# Patient Record
Sex: Male | Born: 1935 | Race: White | Hispanic: No | Smoking: Former smoker
Health system: Southern US, Community
[De-identification: ages and names within clinical notes are randomized; demographics above are authoritative.]

## PROBLEM LIST (undated history)

## (undated) DIAGNOSIS — I509 Heart failure, unspecified: Secondary | ICD-10-CM

## (undated) DIAGNOSIS — E119 Type 2 diabetes mellitus without complications: Secondary | ICD-10-CM

## (undated) DIAGNOSIS — I10 Essential (primary) hypertension: Secondary | ICD-10-CM

## (undated) DIAGNOSIS — J449 Chronic obstructive pulmonary disease, unspecified: Secondary | ICD-10-CM

## (undated) DIAGNOSIS — F32A Depression, unspecified: Secondary | ICD-10-CM

## (undated) DIAGNOSIS — H3530 Unspecified macular degeneration: Secondary | ICD-10-CM

## (undated) DIAGNOSIS — I429 Cardiomyopathy, unspecified: Secondary | ICD-10-CM

## (undated) DIAGNOSIS — I2510 Atherosclerotic heart disease of native coronary artery without angina pectoris: Secondary | ICD-10-CM

## (undated) DIAGNOSIS — F329 Major depressive disorder, single episode, unspecified: Secondary | ICD-10-CM

## (undated) DIAGNOSIS — H9190 Unspecified hearing loss, unspecified ear: Secondary | ICD-10-CM

## (undated) DIAGNOSIS — M1990 Unspecified osteoarthritis, unspecified site: Secondary | ICD-10-CM

## (undated) DIAGNOSIS — E785 Hyperlipidemia, unspecified: Secondary | ICD-10-CM

## (undated) HISTORY — PX: CHOLECYSTECTOMY: SHX55

## (undated) HISTORY — PX: HERNIA REPAIR: SHX51

## (undated) HISTORY — PX: CORONARY ARTERY BYPASS GRAFT: SHX141

---

## 2007-01-22 ENCOUNTER — Ambulatory Visit: Payer: Self-pay | Admitting: Internal Medicine

## 2009-05-02 ENCOUNTER — Inpatient Hospital Stay: Payer: Self-pay | Admitting: Internal Medicine

## 2016-12-27 ENCOUNTER — Ambulatory Visit: Payer: Self-pay

## 2016-12-28 ENCOUNTER — Ambulatory Visit: Payer: Self-pay | Admitting: Urology

## 2016-12-28 NOTE — Progress Notes (Deleted)
   12/28/2016 2:07 PM   Riesa PopeEdgar A Searing [birth year] [id number]  Referring provider: Mickey Farberhies, David, MD [address] [facility] [place], [place] [address]  No chief complaint on file.   HPI: ***     PMH: No past medical history on file.  Surgical History: No past surgical history on file.  Home Medications:  Allergies as of 12/28/2016   Not on File     Medication List    as of 12/28/2016  2:07 PM   You have not been prescribed any medications.     Allergies: Allergies not on file  Family History: No family history on file.  Social History:  has no tobacco, alcohol, and drug history on file.  ROS:                                        Physical Exam: There were no vitals taken for this visit.  Constitutional:  Alert and oriented, No acute distress. HEENT:  AT, moist mucus membranes.  Trachea midline, no masses. Cardiovascular: No clubbing, cyanosis, or edema. Respiratory: Normal respiratory effort, no increased work of breathing. GI: Abdomen is soft, nontender, nondistended, no abdominal masses GU: No CVA tenderness. *** Skin: No rashes, bruises or suspicious lesions. Lymph: No cervical or inguinal adenopathy. Neurologic: Grossly intact, no focal deficits, moving all 4 extremities. Psychiatric: Normal mood and affect.  Laboratory Data: No results found for: WBC, HGB, HCT, MCV, PLT  No results found for: CREATININE  No results found for: PSA  No results found for: TESTOSTERONE  No results found for: HGBA1C  Urinalysis No results found for: COLORURINE, APPEARANCEUR, LABSPEC, PHURINE, GLUCOSEU, HGBUR, BILIRUBINUR, KETONESUR, PROTEINUR, UROBILINOGEN, NITRITE, LEUKOCYTESUR  Pertinent Imaging: ***  Assessment & Plan:  ***  There are no diagnoses linked to this encounter.  No Follow-up on file.  Vanna ScotlandAshley Hartlyn Reigel, MD  [facility] [address], [address] [place], [place] [address] [phone]  [phone]

## 2017-01-25 ENCOUNTER — Ambulatory Visit: Payer: Medicare Other | Admitting: Urology

## 2017-11-06 NOTE — Anesthesia Preprocedure Evaluation (Addendum)
Anesthesia Evaluation  Patient identified by MRN, date of birth, ID band Patient awake    Reviewed: Allergy & Precautions, NPO status , Patient's Chart, lab work & pertinent test results  History of Anesthesia Complications Negative for: history of anesthetic complications  Airway Mallampati: II  TM Distance: >3 FB Neck ROM: Full    Dental  (+) Edentulous Upper, Edentulous Lower   Pulmonary COPD,    Pulmonary exam normal breath sounds clear to auscultation       Cardiovascular hypertension, + CAD (s/p CABG) and +CHF  Normal cardiovascular exam Rhythm:Regular Rate:Normal  4 vessel CABG in 1997.  Echo 2015 showed LVEF 30%, mod AR/MR.     Neuro/Psych HOH    GI/Hepatic negative GI ROS,   Endo/Other  diabetes, Type 2, Insulin Dependent  Renal/GU negative Renal ROS     Musculoskeletal   Abdominal   Peds  Hematology negative hematology ROS (+)   Anesthesia Other Findings BPH  Reproductive/Obstetrics                            Anesthesia Physical Anesthesia Plan  ASA: III  Anesthesia Plan: MAC   Post-op Pain Management:    Induction: Intravenous  PONV Risk Score and Plan: 1 and TIVA and Midazolam  Airway Management Planned: Natural Airway  Additional Equipment:   Intra-op Plan:   Post-operative Plan:   Informed Consent: I have reviewed the patients History and Physical, chart, labs and discussed the procedure including the risks, benefits and alternatives for the proposed anesthesia with the patient or authorized representative who has indicated his/her understanding and acceptance.     Plan Discussed with: CRNA  Anesthesia Plan Comments:         Anesthesia Quick Evaluation

## 2017-11-08 NOTE — Discharge Instructions (Signed)

## 2017-11-12 ENCOUNTER — Ambulatory Visit: Payer: Medicare Other | Admitting: Anesthesiology

## 2017-11-12 ENCOUNTER — Ambulatory Visit
Admission: RE | Admit: 2017-11-12 | Discharge: 2017-11-12 | Disposition: A | Discharge status: Home / Self Care | Payer: Medicare Other | Source: Ambulatory Visit | Attending: Ophthalmology | Admitting: Ophthalmology

## 2017-11-12 ENCOUNTER — Encounter
Admission: RE | Disposition: A | Discharge status: Home / Self Care | Payer: Self-pay | Source: Ambulatory Visit | Attending: Ophthalmology

## 2017-11-12 DIAGNOSIS — R2243 Localized swelling, mass and lump, lower limb, bilateral: Secondary | ICD-10-CM | POA: Diagnosis not present

## 2017-11-12 DIAGNOSIS — M1990 Unspecified osteoarthritis, unspecified site: Secondary | ICD-10-CM | POA: Diagnosis not present

## 2017-11-12 DIAGNOSIS — I10 Essential (primary) hypertension: Secondary | ICD-10-CM | POA: Diagnosis not present

## 2017-11-12 DIAGNOSIS — Z951 Presence of aortocoronary bypass graft: Secondary | ICD-10-CM | POA: Diagnosis not present

## 2017-11-12 DIAGNOSIS — I2510 Atherosclerotic heart disease of native coronary artery without angina pectoris: Secondary | ICD-10-CM | POA: Diagnosis not present

## 2017-11-12 DIAGNOSIS — E539 Vitamin B deficiency, unspecified: Secondary | ICD-10-CM | POA: Insufficient documentation

## 2017-11-12 DIAGNOSIS — E1140 Type 2 diabetes mellitus with diabetic neuropathy, unspecified: Secondary | ICD-10-CM | POA: Insufficient documentation

## 2017-11-12 DIAGNOSIS — E559 Vitamin D deficiency, unspecified: Secondary | ICD-10-CM | POA: Insufficient documentation

## 2017-11-12 DIAGNOSIS — E7800 Pure hypercholesterolemia, unspecified: Secondary | ICD-10-CM | POA: Diagnosis not present

## 2017-11-12 DIAGNOSIS — R2681 Unsteadiness on feet: Secondary | ICD-10-CM | POA: Insufficient documentation

## 2017-11-12 DIAGNOSIS — E1136 Type 2 diabetes mellitus with diabetic cataract: Secondary | ICD-10-CM | POA: Diagnosis not present

## 2017-11-12 DIAGNOSIS — H2512 Age-related nuclear cataract, left eye: Secondary | ICD-10-CM | POA: Diagnosis present

## 2017-11-12 DIAGNOSIS — Z87891 Personal history of nicotine dependence: Secondary | ICD-10-CM | POA: Insufficient documentation

## 2017-11-12 HISTORY — DX: Heart failure, unspecified: I50.9

## 2017-11-12 HISTORY — DX: Type 2 diabetes mellitus without complications: E11.9

## 2017-11-12 HISTORY — DX: Atherosclerotic heart disease of native coronary artery without angina pectoris: I25.10

## 2017-11-12 HISTORY — DX: Chronic obstructive pulmonary disease, unspecified: J44.9

## 2017-11-12 HISTORY — DX: Depression, unspecified: F32.A

## 2017-11-12 HISTORY — DX: Essential (primary) hypertension: I10

## 2017-11-12 HISTORY — DX: Hyperlipidemia, unspecified: E78.5

## 2017-11-12 HISTORY — DX: Unspecified osteoarthritis, unspecified site: M19.90

## 2017-11-12 HISTORY — DX: Unspecified macular degeneration: H35.30

## 2017-11-12 HISTORY — PX: CATARACT EXTRACTION W/PHACO: SHX586

## 2017-11-12 HISTORY — DX: Major depressive disorder, single episode, unspecified: F32.9

## 2017-11-12 HISTORY — DX: Cardiomyopathy, unspecified: I42.9

## 2017-11-12 HISTORY — DX: Unspecified hearing loss, unspecified ear: H91.90

## 2017-11-12 LAB — GLUCOSE, CAPILLARY
GLUCOSE-CAPILLARY: 63 mg/dL — AB (ref 65–99)
GLUCOSE-CAPILLARY: 99 mg/dL (ref 65–99)
GLUCOSE-CAPILLARY: 115 mg/dL — AB (ref 65–99)
Glucose-Capillary: 65 mg/dL (ref 65–99)

## 2017-11-12 SURGERY — PHACOEMULSIFICATION, CATARACT, WITH IOL INSERTION
Anesthesia: Monitor Anesthesia Care | Site: Eye | Laterality: Left | Wound class: "Clean "

## 2017-11-12 MED ORDER — ONDANSETRON HCL 4 MG/2ML IJ SOLN: 4.0000 mg | Freq: Once | INTRAMUSCULAR | Status: DC | PRN

## 2017-11-12 MED ORDER — LIDOCAINE HCL (PF) 2 % IJ SOLN
INTRAMUSCULAR | Status: DC | PRN
  Administered 2017-11-12: 1.5 mL via INTRAOCULAR

## 2017-11-12 MED ORDER — SODIUM HYALURONATE 10 MG/ML IO SOLN
INTRAOCULAR | Status: DC | PRN
  Administered 2017-11-12: 0.55 mL via INTRAOCULAR

## 2017-11-12 MED ORDER — ACETAMINOPHEN 325 MG PO TABS: 650.0000 mg | ORAL_TABLET | Freq: Once | ORAL | Status: DC | PRN

## 2017-11-12 MED ORDER — DEXTROSE 50 % IV SOLN
12.5000 g | Freq: Once | INTRAVENOUS | Status: AC
  Administered 2017-11-12: 12.5 g via INTRAVENOUS

## 2017-11-12 MED ORDER — ARMC OPHTHALMIC DILATING DROPS
1.0000 "application " | OPHTHALMIC | Status: DC | PRN
  Administered 2017-11-12 (×3): 1 via OPHTHALMIC

## 2017-11-12 MED ORDER — FENTANYL CITRATE (PF) 100 MCG/2ML IJ SOLN
INTRAMUSCULAR | Status: DC | PRN
  Administered 2017-11-12: 25 ug via INTRAVENOUS

## 2017-11-12 MED ORDER — MIDAZOLAM HCL 2 MG/2ML IJ SOLN
INTRAMUSCULAR | Status: DC | PRN
  Administered 2017-11-12: 0.5 mg via INTRAVENOUS

## 2017-11-12 MED ORDER — EPINEPHRINE PF 1 MG/ML IJ SOLN
INTRAOCULAR | Status: DC | PRN
  Administered 2017-11-12: 92 mL via OPHTHALMIC

## 2017-11-12 MED ORDER — ACETAMINOPHEN 160 MG/5ML PO SOLN: 325.0000 mg | ORAL | Status: DC | PRN

## 2017-11-12 MED ORDER — LACTATED RINGERS IV SOLN: INTRAVENOUS | Status: DC

## 2017-11-12 MED ORDER — SODIUM HYALURONATE 23 MG/ML IO SOLN
INTRAOCULAR | Status: DC | PRN
  Administered 2017-11-12: 0.6 mL via INTRAOCULAR

## 2017-11-12 MED ORDER — MOXIFLOXACIN HCL 0.5 % OP SOLN
OPHTHALMIC | Status: DC | PRN
  Administered 2017-11-12: 0.2 mL via OPHTHALMIC

## 2017-11-12 SURGICAL SUPPLY — 17 items
CANNULA ANT/CHMB 27G (MISCELLANEOUS) ×1 IMPLANT
CANNULA ANT/CHMB 27GA (MISCELLANEOUS) ×3 IMPLANT
DISSECTOR HYDRO NUCLEUS 50X22 (MISCELLANEOUS) ×3 IMPLANT
GLOVE BIO SURGEON STRL SZ8 (GLOVE) ×3 IMPLANT
GLOVE SURG LX 7.5 STRW (GLOVE) ×2
GLOVE SURG LX STRL 7.5 STRW (GLOVE) ×1 IMPLANT
GOWN STRL REUS W/ TWL LRG LVL3 (GOWN DISPOSABLE) ×2 IMPLANT
GOWN STRL REUS W/TWL LRG LVL3 (GOWN DISPOSABLE) ×4
LENS IOL TECNIS ITEC 21.0 (Intraocular Lens) ×2 IMPLANT
MARKER SKIN DUAL TIP RULER LAB (MISCELLANEOUS) ×3 IMPLANT
PACK CATARACT (MISCELLANEOUS) ×3 IMPLANT
PACK DR. KING ARMS (PACKS) ×3 IMPLANT
PACK EYE AFTER SURG (MISCELLANEOUS) ×3 IMPLANT
SYR 3ML LL SCALE MARK (SYRINGE) ×3 IMPLANT
SYR TB 1ML LUER SLIP (SYRINGE) ×3 IMPLANT
WATER STERILE IRR 500ML POUR (IV SOLUTION) ×3 IMPLANT
WIPE NON LINTING 3.25X3.25 (MISCELLANEOUS) ×3 IMPLANT

## 2017-11-12 NOTE — Anesthesia Procedure Notes (Signed)
Procedure Name: MAC Performed by: Joby Hershkowitz, CRNA Pre-anesthesia Checklist: Patient identified, Emergency Drugs available, Suction available, Timeout performed and Patient being monitored Patient Re-evaluated:Patient Re-evaluated prior to induction Oxygen Delivery Method: Nasal cannula Placement Confirmation: positive ETCO2       

## 2017-11-12 NOTE — H&P (Signed)
The History and Physical notes are on paper, have been signed, and are to be scanned.   I have examined the patient and there are no changes to the H&P.   Willey Blade 11/12/2017 8:04 AM

## 2017-11-12 NOTE — Transfer of Care (Signed)
Immediate Anesthesia Transfer of Care Note  Patient: Fred Watson  Procedure(s) Performed: CATARACT EXTRACTION PHACO AND INTRAOCULAR LENS PLACEMENT (IOC) LEFT DIABETIC (Left Eye)  Patient Location: PACU  Anesthesia Type: MAC  Level of Consciousness: awake, alert  and patient cooperative  Airway and Oxygen Therapy: Patient Spontanous Breathing and Patient connected to supplemental oxygen  Post-op Assessment: Post-op Vital signs reviewed, Patient's Cardiovascular Status Stable, Respiratory Function Stable, Patent Airway and No signs of Nausea or vomiting  Post-op Vital Signs: Reviewed and stable  Complications: No apparent anesthesia complications

## 2017-11-12 NOTE — Op Note (Signed)
OPERATIVE NOTE  Fred Watson [id number] 11/12/2017   PREOPERATIVE DIAGNOSIS:  Nuclear sclerotic cataract left eye.  H25.12   POSTOPERATIVE DIAGNOSIS:    Nuclear sclerotic cataract left eye.     PROCEDURE:  Phacoemusification with posterior chamber intraocular lens placement of the left eye   LENS:   Implant Name Type Inv. Item Serial No. Manufacturer Lot No. LRB No. Used  LENS IOL DIOP 21.0 - [id number] Intraocular Lens LENS IOL DIOP 21.0 [id number] AMO  Left 1       PCB00 +21.0   ULTRASOUND TIME: 0 minutes 56.2 seconds.  CDE 7.73   SURGEON:  Willey Blade, MD, MPH   ANESTHESIA:  Topical with tetracaine drops augmented with 1% preservative-free intracameral lidocaine.  ESTIMATED BLOOD LOSS: <1 mL   COMPLICATIONS:  None.   DESCRIPTION OF PROCEDURE:  The patient was identified in the holding room and transported to the operating room and placed in the supine position under the operating microscope.  The left eye was identified as the operative eye and it was prepped and draped in the usual sterile ophthalmic fashion.   A 1.0 millimeter clear-corneal paracentesis was made at the 5:00 position. 0.5 ml of preservative-free 1% lidocaine with epinephrine was injected into the anterior chamber.  The anterior chamber was filled with Healon 5 viscoelastic.  A 2.4 millimeter keratome was used to make a near-clear corneal incision at the 2:00 position.  A curvilinear capsulorrhexis was made with a cystotome and capsulorrhexis forceps.  Balanced salt solution was used to hydrodissect and hydrodelineate the nucleus.   Phacoemulsification was then used in stop and chop fashion to remove the lens nucleus and epinucleus.  The remaining cortex was then removed using the irrigation and aspiration handpiece. Healon was then placed into the capsular bag to distend it for lens placement.  A lens was then injected into the capsular bag.  The remaining viscoelastic was aspirated.   Wounds were hydrated  with balanced salt solution.  The anterior chamber was inflated to a physiologic pressure with balanced salt solution.  Intracameral vigamox 0.1 mL undiltued was injected into the eye and a drop placed onto the ocular surface.  No wound leaks were noted.  The patient was taken to the recovery room in stable condition without complications of anesthesia or surgery  Willey Blade 11/12/2017, 8:38 AM

## 2017-11-12 NOTE — Anesthesia Postprocedure Evaluation (Signed)
Anesthesia Post Note  Patient: Fred Watson  Procedure(s) Performed: CATARACT EXTRACTION PHACO AND INTRAOCULAR LENS PLACEMENT (IOC) LEFT DIABETIC (Left Eye)  Patient location during evaluation: PACU Anesthesia Type: MAC Level of consciousness: awake and alert, oriented and patient cooperative Pain management: pain level controlled Vital Signs Assessment: post-procedure vital signs reviewed and stable Respiratory status: spontaneous breathing, nonlabored ventilation and respiratory function stable Cardiovascular status: blood pressure returned to baseline and stable Postop Assessment: adequate PO intake Anesthetic complications: no    Darrin Nipper

## 2017-11-14 ENCOUNTER — Encounter: Payer: Self-pay | Admitting: Ophthalmology

## 2018-02-22 ENCOUNTER — Inpatient Hospital Stay
Admission: EM | Admit: 2018-02-22 | Discharge: 2018-02-24 | DRG: 638 | Disposition: A | Discharge status: Home Health | Payer: Medicare Other | Attending: Specialist | Admitting: Specialist

## 2018-02-22 ENCOUNTER — Other Ambulatory Visit: Payer: Self-pay

## 2018-02-22 ENCOUNTER — Encounter: Payer: Self-pay | Admitting: Emergency Medicine

## 2018-02-22 DIAGNOSIS — J449 Chronic obstructive pulmonary disease, unspecified: Secondary | ICD-10-CM | POA: Diagnosis present

## 2018-02-22 DIAGNOSIS — N183 Chronic kidney disease, stage 3 (moderate): Secondary | ICD-10-CM | POA: Diagnosis present

## 2018-02-22 DIAGNOSIS — Z961 Presence of intraocular lens: Secondary | ICD-10-CM | POA: Diagnosis present

## 2018-02-22 DIAGNOSIS — E785 Hyperlipidemia, unspecified: Secondary | ICD-10-CM | POA: Diagnosis present

## 2018-02-22 DIAGNOSIS — N400 Enlarged prostate without lower urinary tract symptoms: Secondary | ICD-10-CM | POA: Diagnosis present

## 2018-02-22 DIAGNOSIS — H9190 Unspecified hearing loss, unspecified ear: Secondary | ICD-10-CM | POA: Diagnosis present

## 2018-02-22 DIAGNOSIS — Z951 Presence of aortocoronary bypass graft: Secondary | ICD-10-CM

## 2018-02-22 DIAGNOSIS — Z7982 Long term (current) use of aspirin: Secondary | ICD-10-CM

## 2018-02-22 DIAGNOSIS — E1122 Type 2 diabetes mellitus with diabetic chronic kidney disease: Secondary | ICD-10-CM | POA: Diagnosis present

## 2018-02-22 DIAGNOSIS — E860 Dehydration: Secondary | ICD-10-CM | POA: Diagnosis not present

## 2018-02-22 DIAGNOSIS — Z8249 Family history of ischemic heart disease and other diseases of the circulatory system: Secondary | ICD-10-CM

## 2018-02-22 DIAGNOSIS — E11649 Type 2 diabetes mellitus with hypoglycemia without coma: Secondary | ICD-10-CM | POA: Diagnosis not present

## 2018-02-22 DIAGNOSIS — I429 Cardiomyopathy, unspecified: Secondary | ICD-10-CM | POA: Diagnosis present

## 2018-02-22 DIAGNOSIS — I5022 Chronic systolic (congestive) heart failure: Secondary | ICD-10-CM | POA: Diagnosis present

## 2018-02-22 DIAGNOSIS — Z823 Family history of stroke: Secondary | ICD-10-CM

## 2018-02-22 DIAGNOSIS — E875 Hyperkalemia: Secondary | ICD-10-CM | POA: Diagnosis present

## 2018-02-22 DIAGNOSIS — Z7984 Long term (current) use of oral hypoglycemic drugs: Secondary | ICD-10-CM

## 2018-02-22 DIAGNOSIS — I130 Hypertensive heart and chronic kidney disease with heart failure and stage 1 through stage 4 chronic kidney disease, or unspecified chronic kidney disease: Secondary | ICD-10-CM | POA: Diagnosis present

## 2018-02-22 DIAGNOSIS — I2510 Atherosclerotic heart disease of native coronary artery without angina pectoris: Secondary | ICD-10-CM | POA: Diagnosis present

## 2018-02-22 DIAGNOSIS — Z888 Allergy status to other drugs, medicaments and biological substances status: Secondary | ICD-10-CM

## 2018-02-22 DIAGNOSIS — E162 Hypoglycemia, unspecified: Secondary | ICD-10-CM | POA: Diagnosis present

## 2018-02-22 DIAGNOSIS — E119 Type 2 diabetes mellitus without complications: Secondary | ICD-10-CM

## 2018-02-22 DIAGNOSIS — Z9842 Cataract extraction status, left eye: Secondary | ICD-10-CM

## 2018-02-22 DIAGNOSIS — N390 Urinary tract infection, site not specified: Secondary | ICD-10-CM | POA: Diagnosis present

## 2018-02-22 DIAGNOSIS — H3530 Unspecified macular degeneration: Secondary | ICD-10-CM | POA: Diagnosis present

## 2018-02-22 DIAGNOSIS — F329 Major depressive disorder, single episode, unspecified: Secondary | ICD-10-CM | POA: Diagnosis present

## 2018-02-22 DIAGNOSIS — M1990 Unspecified osteoarthritis, unspecified site: Secondary | ICD-10-CM | POA: Diagnosis present

## 2018-02-22 DIAGNOSIS — N179 Acute kidney failure, unspecified: Secondary | ICD-10-CM | POA: Diagnosis present

## 2018-02-22 LAB — URINALYSIS, COMPLETE (UACMP) WITH MICROSCOPIC
BILIRUBIN URINE: NEGATIVE
GLUCOSE, UA: NEGATIVE mg/dL
KETONES UR: NEGATIVE mg/dL
NITRITE: NEGATIVE
PROTEIN: 30 mg/dL — AB
Specific Gravity, Urine: 1.005 (ref 1.005–1.030)
pH: 6 (ref 5.0–8.0)

## 2018-02-22 LAB — COMPREHENSIVE METABOLIC PANEL
ALT: 13 U/L (ref 0–44)
AST: 15 U/L (ref 15–41)
Albumin: 3.2 g/dL — ABNORMAL LOW (ref 3.5–5.0)
Alkaline Phosphatase: 92 U/L (ref 38–126)
Anion gap: 6 (ref 5–15)
BUN: 41 mg/dL — ABNORMAL HIGH (ref 8–23)
CHLORIDE: 100 mmol/L (ref 98–111)
CO2: 29 mmol/L (ref 22–32)
Calcium: 7.8 mg/dL — ABNORMAL LOW (ref 8.9–10.3)
Creatinine, Ser: 1.95 mg/dL — ABNORMAL HIGH (ref 0.61–1.24)
GFR, EST AFRICAN AMERICAN: 33 mL/min — AB (ref >60)
GFR, EST NON AFRICAN AMERICAN: 29 mL/min — AB (ref >60)
Glucose, Bld: 115 mg/dL — ABNORMAL HIGH (ref 70–99)
POTASSIUM: 5.5 mmol/L — AB (ref 3.5–5.1)
Sodium: 135 mmol/L (ref 135–145)
Total Bilirubin: 0.8 mg/dL (ref 0.3–1.2)
Total Protein: 6.5 g/dL (ref 6.5–8.1)

## 2018-02-22 LAB — CBC
HCT: 36.1 % — ABNORMAL LOW (ref 40.0–52.0)
HEMOGLOBIN: 12.4 g/dL — AB (ref 13.0–18.0)
MCH: 32.5 pg (ref 26.0–34.0)
MCHC: 34.3 g/dL (ref 32.0–36.0)
MCV: 94.7 fL (ref 80.0–100.0)
PLATELETS: 202 10*3/uL (ref 150–440)
RBC: 3.81 MIL/uL — AB (ref 4.40–5.90)
RDW: 13.5 % (ref 11.5–14.5)
WBC: 6.8 10*3/uL (ref 3.8–10.6)

## 2018-02-22 LAB — BASIC METABOLIC PANEL
ANION GAP: 4 — AB (ref 5–15)
BUN: 40 mg/dL — AB (ref 8–23)
CALCIUM: 7.8 mg/dL — AB (ref 8.9–10.3)
CO2: 29 mmol/L (ref 22–32)
Chloride: 103 mmol/L (ref 98–111)
Creatinine, Ser: 1.91 mg/dL — ABNORMAL HIGH (ref 0.61–1.24)
GFR calc Af Amer: 34 mL/min — ABNORMAL LOW (ref >60)
GFR calc non Af Amer: 29 mL/min — ABNORMAL LOW (ref >60)
GLUCOSE: 128 mg/dL — AB (ref 70–99)
Potassium: 5.4 mmol/L — ABNORMAL HIGH (ref 3.5–5.1)
Sodium: 136 mmol/L (ref 135–145)

## 2018-02-22 LAB — GLUCOSE, CAPILLARY
GLUCOSE-CAPILLARY: 104 mg/dL — AB (ref 70–99)
GLUCOSE-CAPILLARY: 86 mg/dL (ref 70–99)
Glucose-Capillary: 147 mg/dL — ABNORMAL HIGH (ref 70–99)
Glucose-Capillary: 135 mg/dL — ABNORMAL HIGH (ref 70–99)
Glucose-Capillary: 69 mg/dL — ABNORMAL LOW (ref 70–99)

## 2018-02-22 LAB — CREATININE, SERUM
CREATININE: 1.63 mg/dL — AB (ref 0.61–1.24)
GFR, EST AFRICAN AMERICAN: 41 mL/min — AB (ref >60)
GFR, EST NON AFRICAN AMERICAN: 36 mL/min — AB (ref >60)

## 2018-02-22 MED ORDER — GABAPENTIN 300 MG PO CAPS
300.0000 mg | ORAL_CAPSULE | Freq: Two times a day (BID) | ORAL | Status: DC
  Administered 2018-02-22 – 2018-02-24 (×4): 300 mg via ORAL
  Filled 2018-02-22 (×4): qty 1

## 2018-02-22 MED ORDER — SENNOSIDES-DOCUSATE SODIUM 8.6-50 MG PO TABS: 1.0000 | ORAL_TABLET | Freq: Every evening | ORAL | Status: DC | PRN

## 2018-02-22 MED ORDER — ONDANSETRON HCL 4 MG PO TABS: 4.0000 mg | ORAL_TABLET | Freq: Four times a day (QID) | ORAL | Status: DC | PRN

## 2018-02-22 MED ORDER — HEPARIN SODIUM (PORCINE) 5000 UNIT/ML IJ SOLN
5000.0000 [IU] | Freq: Three times a day (TID) | INTRAMUSCULAR | Status: DC
  Administered 2018-02-22 – 2018-02-24 (×6): 5000 [IU] via SUBCUTANEOUS
  Filled 2018-02-22 (×6): qty 1

## 2018-02-22 MED ORDER — DOCUSATE SODIUM 100 MG PO CAPS
100.0000 mg | ORAL_CAPSULE | Freq: Two times a day (BID) | ORAL | Status: DC
  Administered 2018-02-22 – 2018-02-24 (×3): 100 mg via ORAL
  Filled 2018-02-22 (×3): qty 1

## 2018-02-22 MED ORDER — SODIUM CHLORIDE 0.9 % IV SOLN
1.0000 g | INTRAVENOUS | Status: DC
  Administered 2018-02-23 – 2018-02-24 (×2): 1 g via INTRAVENOUS
  Filled 2018-02-22: qty 1
  Filled 2018-02-22: qty 10
  Filled 2018-02-22: qty 1

## 2018-02-22 MED ORDER — SODIUM CHLORIDE 0.9 % IV SOLN
1.0000 g | INTRAVENOUS | Status: DC
  Filled 2018-02-22 (×2): qty 10

## 2018-02-22 MED ORDER — BISACODYL 5 MG PO TBEC: 5.0000 mg | DELAYED_RELEASE_TABLET | Freq: Every day | ORAL | Status: DC | PRN

## 2018-02-22 MED ORDER — SODIUM CHLORIDE 0.9 % IV SOLN
INTRAVENOUS | Status: DC
  Administered 2018-02-22 (×2): via INTRAVENOUS

## 2018-02-22 MED ORDER — PRAVASTATIN SODIUM 40 MG PO TABS
40.0000 mg | ORAL_TABLET | Freq: Every day | ORAL | Status: DC
  Administered 2018-02-23 – 2018-02-24 (×2): 40 mg via ORAL
  Filled 2018-02-22: qty 2
  Filled 2018-02-22: qty 1
  Filled 2018-02-22: qty 2
  Filled 2018-02-22: qty 1

## 2018-02-22 MED ORDER — ACETAMINOPHEN 325 MG PO TABS: 650.0000 mg | ORAL_TABLET | Freq: Four times a day (QID) | ORAL | Status: DC | PRN

## 2018-02-22 MED ORDER — ASPIRIN EC 81 MG PO TBEC
81.0000 mg | DELAYED_RELEASE_TABLET | Freq: Every day | ORAL | Status: DC
  Administered 2018-02-23 – 2018-02-24 (×2): 81 mg via ORAL
  Filled 2018-02-22 (×2): qty 1

## 2018-02-22 MED ORDER — SODIUM CHLORIDE 0.9 % IV SOLN
1.0000 g | Freq: Once | INTRAVENOUS | Status: AC
  Administered 2018-02-22: 1 g via INTRAVENOUS
  Filled 2018-02-22: qty 10

## 2018-02-22 MED ORDER — SODIUM CHLORIDE 0.9 % IV BOLUS
500.0000 mL | Freq: Once | INTRAVENOUS | Status: AC
  Administered 2018-02-22: 500 mL via INTRAVENOUS

## 2018-02-22 MED ORDER — FLUOXETINE HCL 10 MG PO CAPS
10.0000 mg | ORAL_CAPSULE | Freq: Every day | ORAL | Status: DC
  Administered 2018-02-23 – 2018-02-24 (×2): 10 mg via ORAL
  Filled 2018-02-22 (×2): qty 1

## 2018-02-22 MED ORDER — HYDRALAZINE HCL 20 MG/ML IJ SOLN: 10.0000 mg | Freq: Four times a day (QID) | INTRAMUSCULAR | Status: DC | PRN

## 2018-02-22 MED ORDER — METOPROLOL SUCCINATE ER 50 MG PO TB24
100.0000 mg | ORAL_TABLET | Freq: Every day | ORAL | Status: DC
  Administered 2018-02-23 – 2018-02-24 (×2): 100 mg via ORAL
  Filled 2018-02-22 (×2): qty 2

## 2018-02-22 MED ORDER — FINASTERIDE 5 MG PO TABS
5.0000 mg | ORAL_TABLET | Freq: Every day | ORAL | Status: DC
  Administered 2018-02-23 – 2018-02-24 (×2): 5 mg via ORAL
  Filled 2018-02-22 (×2): qty 1

## 2018-02-22 MED ORDER — ACETAMINOPHEN 650 MG RE SUPP: 650.0000 mg | Freq: Four times a day (QID) | RECTAL | Status: DC | PRN

## 2018-02-22 MED ORDER — ALBUTEROL SULFATE (2.5 MG/3ML) 0.083% IN NEBU: 2.5000 mg | INHALATION_SOLUTION | RESPIRATORY_TRACT | Status: DC | PRN

## 2018-02-22 MED ORDER — INSULIN ASPART 100 UNIT/ML ~~LOC~~ SOLN
0.0000 [IU] | Freq: Three times a day (TID) | SUBCUTANEOUS | Status: DC
  Administered 2018-02-22: 18:00:00 1 [IU] via SUBCUTANEOUS
  Administered 2018-02-23: 2 [IU] via SUBCUTANEOUS
  Filled 2018-02-22 (×2): qty 1

## 2018-02-22 MED ORDER — ONDANSETRON HCL 4 MG/2ML IJ SOLN: 4.0000 mg | Freq: Four times a day (QID) | INTRAMUSCULAR | Status: DC | PRN

## 2018-02-22 NOTE — ED Notes (Signed)
Called lab to add on urine culture, [facility]

## 2018-02-22 NOTE — Plan of Care (Signed)
  Problem: Education: °Goal: Knowledge of General Education information will improve °Description: Including pain rating scale, medication(s)/side effects and non-pharmacologic comfort measures °Outcome: Progressing °  °Problem: Safety: °Goal: Ability to remain free from injury will improve °Outcome: Progressing °  °Problem: Urinary Elimination: °Goal: Signs and symptoms of infection will decrease °Outcome: Progressing °  °

## 2018-02-22 NOTE — ED Triage Notes (Signed)
Pt arrived via EMS from home with reports of altered mental status. When EMS arrived, pt was found to have CBG of 46, pt is NIDDM.  Pt ate breakfast this morning per EMS, Pt also reports painful urination and frequency per EMS pt reported to them he has a UTI.

## 2018-02-22 NOTE — ED Notes (Addendum)
Both EDP Dr. Alphonzo LemmingsMcShane and Attending Dr. Imogene Burnhen are aware of potassium of 5.5.  Pt is receiving appropriate fluids to help with potassium. NO further orders given at this time. Pt is able to transfer to room 125 from the ED.

## 2018-02-22 NOTE — H&P (Signed)
[facility] [place] [facility]   PATIENT NAME: Fred Watson    MR#:  [id number]  DATE OF BIRTH:  Jan 05, 1927  DATE OF ADMISSION:  02/22/2018  PRIMARY CARE PHYSICIAN: Mickey Farberhies, David, MD   REQUESTING/REFERRING PHYSICIAN: Dr. Alphonzo LemmingsMcshane  CHIEF COMPLAINT:   Chief Complaint  Patient presents with  . Altered Mental Status  . Hypoglycemia  . Urinary Tract Infection   Altered mental status, hypoglycemia and UTI. HISTORY OF PRESENT ILLNESS:  Fred Menddgar Cordrey  is a 84 y.o. male with a known history of arthritis, CHF, COPD, CAD, hypertension, diabetes, hyperlipidemia and depression.  The patient was sent from home to ED due to above chief complaints.  The patient was seen and evaluated by EMS due to confusion and found sugar at 46.  The patient has history of diabetes, on Amaryl.  He complains of generalized weakness, dysuria and urinary frequency for about 2 days.  He is found UTI and renal failure in the ED.  Dr. Alphonzo LemmingsMcshane requested admission. PAST MEDICAL HISTORY:   Past Medical History:  Diagnosis Date  . Arthritis   . Cardiomyopathy (HCC)   . CHF (congestive heart failure) (HCC)   . COPD (chronic obstructive pulmonary disease) (HCC)   . Coronary artery disease   . Depression   . Diabetes mellitus without complication (HCC)   . Hearing loss   . Hyperlipidemia   . Hypertension   . Macular degeneration     PAST SURGICAL HISTORY:   Past Surgical History:  Procedure Laterality Date  . CATARACT EXTRACTION W/PHACO Left 11/12/2017   Procedure: CATARACT EXTRACTION PHACO AND INTRAOCULAR LENS PLACEMENT (IOC) LEFT DIABETIC;  Surgeon: Nevada CraneKing, Bradley Mark, MD;  [facility];  Service: Ophthalmology;  Laterality: Left;  Diabetic  . CHOLECYSTECTOMY    . CORONARY ARTERY BYPASS GRAFT    . HERNIA REPAIR      SOCIAL HISTORY:   Social History   Tobacco Use  . Smoking status: Never Smoker  . Smokeless tobacco: Never Used  Substance Use Topics  . Alcohol use:  Not on file    FAMILY HISTORY:   Family History  Problem Relation Age of Onset  . Stroke Mother   . CAD Father   . Heart attack Father     DRUG ALLERGIES:   Allergies  Allergen Reactions  . Ezetimibe Other (See Comments)  . Niacin Other (See Comments)    Flushing  . Ace Inhibitors Cough    Tolerating lisinopril 20 mg without side effects 02/01/2015  . Atorvastatin Other (See Comments)    Nightmares  . Losartan Other (See Comments)    REVIEW OF SYSTEMS:   Review of Systems  Constitutional: Positive for malaise/fatigue. Negative for chills and fever.  HENT: Negative for sore throat.   Eyes: Negative for blurred vision and double vision.  Respiratory: Negative for cough, hemoptysis, shortness of breath, wheezing and stridor.   Cardiovascular: Negative for chest pain, palpitations, orthopnea and leg swelling.  Gastrointestinal: Negative for abdominal pain, blood in stool, diarrhea, melena, nausea and vomiting.  Genitourinary: Positive for dysuria and frequency. Negative for flank pain and hematuria.  Musculoskeletal: Negative for back pain and joint pain.  Skin: Negative for rash.  Neurological: Positive for headaches. Negative for dizziness, sensory change, focal weakness, seizures, loss of consciousness and weakness.  Endo/Heme/Allergies: Negative for polydipsia.  Psychiatric/Behavioral: Negative for depression. The patient is not nervous/anxious.     MEDICATIONS AT HOME:   Prior to Admission medications   Medication Sig  Start Date End Date Taking? Authorizing Provider  aspirin EC 81 MG tablet Take 81 mg by mouth daily.   Yes [provider]  cholecalciferol (VITAMIN D) 1000 units tablet Take 1,000 Units by mouth daily.   Yes [provider]  docusate sodium (COLACE) 100 MG capsule Take 100 mg by mouth 2 (two) times daily.   Yes [provider]  finasteride (PROSCAR) 5 MG tablet Take 5 mg by mouth daily.   Yes [provider]    FLUoxetine (PROZAC) 10 MG tablet Take 10 mg by mouth daily.   Yes [provider]  gabapentin (NEURONTIN) 300 MG capsule Take 300 mg by mouth 2 (two) times daily.   Yes [provider]  glimepiride (AMARYL) 4 MG tablet Take 4 mg by mouth daily with breakfast.   Yes [provider]  lisinopril (PRINIVIL,ZESTRIL) 20 MG tablet Take 20 mg by mouth daily.   Yes [provider]  metoprolol succinate (TOPROL-XL) 100 MG 24 hr tablet Take 100 mg by mouth daily. Take with or immediately following a meal.   Yes [provider]  pravastatin (PRAVACHOL) 40 MG tablet Take 40 mg by mouth daily.   Yes [provider]  furosemide (LASIX) 40 MG tablet Take 40 mg by mouth.    [provider]      VITAL SIGNS:  Blood pressure (!) 150/60, pulse 61, temperature 97.8 F (36.6 C), temperature source Oral, resp. rate (!) 22, height 6\' 1"  (1.854 m), weight 81.6 kg, SpO2 99 %.  PHYSICAL EXAMINATION:  Physical Exam  GENERAL:  84 y.o.-year-old patient lying in the bed with no acute distress.  EYES: Pupils equal, round, reactive to light and accommodation. No scleral icterus. Extraocular muscles intact.  HEENT: Head atraumatic, normocephalic. Oropharynx and nasopharynx clear.  NECK:  Supple, no jugular venous distention. No thyroid enlargement, no tenderness.  LUNGS: Normal breath sounds bilaterally, no wheezing, rales,rhonchi or crepitation. No use of accessory muscles of respiration.  CARDIOVASCULAR: S1, S2 normal. No murmurs, rubs, or gallops.  ABDOMEN: Soft, nontender, nondistended. Bowel sounds present. No organomegaly or mass.  EXTREMITIES: No pedal edema, cyanosis, or clubbing.  NEUROLOGIC: Cranial nerves II through XII are intact. Muscle strength 4/5 in all extremities. Sensation intact. Gait not checked.  PSYCHIATRIC: The patient is alert and oriented x 3.  SKIN: No obvious rash, lesion, or ulcer.   LABORATORY PANEL:   CBC Recent Labs  Lab  02/22/18 1105  WBC 6.8  HGB 12.4*  HCT 36.1*  PLT 202   ------------------------------------------------------------------------------------------------------------------  Chemistries  Recent Labs  Lab 02/22/18 1131  NA 135  K 5.5*  CL 100  CO2 29  GLUCOSE 115*  BUN 41*  CREATININE 1.95*  CALCIUM 7.8*  AST 15  ALT 13  ALKPHOS 92  BILITOT 0.8   ------------------------------------------------------------------------------------------------------------------  Cardiac Enzymes No results for input(s): TROPONINI in the last 168 hours. ------------------------------------------------------------------------------------------------------------------  RADIOLOGY:  No results found.    IMPRESSION AND PLAN:   Hypoglycemia and diabetes 2. Patient will be placed for observation. Start sliding scale, hold Amaryl.  D50 as needed.  Encourage oral intake.  UTI.  Continue Rocephin and follow-up urine culture.  Acute renal failure on CKD. Hold diuretics and lisinopril, IV rehydration and follow-up BMP.  Hyperkalemia.  Due to above.  IV fluid support and follow-up BMP.  Hypertension.  Hold Lasix and lisinopril, continue Lopressor.  All the records are reviewed and case discussed with ED provider. Management plans discussed with the patient, family  and they are in agreement.  CODE STATUS: Full code  TOTAL TIME TAKING CARE OF THIS PATIENT: 38 minutes.    Shaune Pollack M.D on 02/22/2018 at 2:11 PM  Between 7am to 6pm - Pager - [phone]  After 6pm go to [email or web address] - Scientist, research (life sciences) [facility]  Office  [phone]  CC: Primary care physician; Mickey Farber, MD   Note: This dictation was prepared with Dragon dictation along with smaller phrase technology. Any transcriptional errors that result from this process are unin

## 2018-02-22 NOTE — Progress Notes (Signed)
Advanced Care Plan.  Purpose of Encounter: CODE STATUS. Parties in Attendance: The patient and me. Patient's Decisional Capacity: Yes. Medical Story: Fred Watson  is a 84 y.o. male with a known history of arthritis, CHF, COPD, CAD, hypertension, diabetes, hyperlipidemia and depression.  The patient is being admitted for acute renal failure on CKD, UTI and hypoglycemia.  I discussed with the patient about his current condition, prognosis and CODE STATUS.  He stated that he want to be resuscitated and intubated if necessary. Plan:  Code Status: Full code. Time spent discussing advance care planning: 16-17 minutes.

## 2018-02-22 NOTE — ED Provider Notes (Signed)
[facility] Emergency Department Provider Note  ____________________________________________   I have reviewed the triage vital signs and the nursing notes. Where available I have reviewed prior notes and, if possible and indicated, outside hospital notes.    HISTORY  Chief Complaint Altered Mental Status; Hypoglycemia; and Urinary Tract Infection    HPI Fred Watson is a 84 y.o. male   Presents today with a chief complaint initially of confusion.  He was seen and evaluated by EMS found to have blood sugar of 46.  He is on Amaryl.  He states he has been feeling the symptoms of urinary tract infection for a day or 2 with burning of urine.  After given sugar, patient returned to what is apparently close to his baseline.  There is no reported trauma.  Patient is very hard of hearing but otherwise is awake and alert in no acute distress.  He states aside from dysuria he has no complaints.     Past Medical History:  Diagnosis Date  . Arthritis   . Cardiomyopathy (HCC)   . CHF (congestive heart failure) (HCC)   . COPD (chronic obstructive pulmonary disease) (HCC)   . Coronary artery disease   . Depression   . Diabetes mellitus without complication (HCC)   . Hearing loss   . Hyperlipidemia   . Hypertension   . Macular degeneration     There are no active problems to display for this patient.   Past Surgical History:  Procedure Laterality Date  . CATARACT EXTRACTION W/PHACO Left 11/12/2017   Procedure: CATARACT EXTRACTION PHACO AND INTRAOCULAR LENS PLACEMENT (IOC) LEFT DIABETIC;  Surgeon: Nevada CraneKing, Bradley Mark, MD;  [facility];  Service: Ophthalmology;  Laterality: Left;  Diabetic  . CHOLECYSTECTOMY    . CORONARY ARTERY BYPASS GRAFT    . HERNIA REPAIR      Prior to Admission medications   Medication Sig Start Date End Date Taking? Authorizing Provider  aspirin EC 81 MG tablet Take 81 mg by mouth daily.   Yes [provider]   cholecalciferol (VITAMIN D) 1000 units tablet Take 1,000 Units by mouth daily.   Yes [provider]  docusate sodium (COLACE) 100 MG capsule Take 100 mg by mouth 2 (two) times daily.   Yes [provider]  finasteride (PROSCAR) 5 MG tablet Take 5 mg by mouth daily.   Yes [provider]  FLUoxetine (PROZAC) 10 MG tablet Take 10 mg by mouth daily.   Yes [provider]  gabapentin (NEURONTIN) 300 MG capsule Take 300 mg by mouth 2 (two) times daily.   Yes [provider]  glimepiride (AMARYL) 4 MG tablet Take 4 mg by mouth daily with breakfast.   Yes [provider]  lisinopril (PRINIVIL,ZESTRIL) 20 MG tablet Take 20 mg by mouth daily.   Yes [provider]  metoprolol succinate (TOPROL-XL) 100 MG 24 hr tablet Take 100 mg by mouth daily. Take with or immediately following a meal.   Yes [provider]  pravastatin (PRAVACHOL) 40 MG tablet Take 40 mg by mouth daily.   Yes [provider]  furosemide (LASIX) 40 MG tablet Take 40 mg by mouth.    [provider]    Allergies Ezetimibe; Niacin; Ace inhibitors; Atorvastatin; and Losartan  History reviewed. No pertinent family history.  Social History Social History   Tobacco Use  . Smoking status: Never Smoker  . Smokeless tobacco: Never Used  Substance Use Topics  . Alcohol use:  Not on file  . Drug use: Not on file    Review of Systems Constitutional: No fever/chills Eyes: No visual changes. ENT: No sore throat. No stiff neck no neck pain Cardiovascular: Denies chest pain. Respiratory: Denies shortness of breath. Gastrointestinal:   no vomiting.  No diarrhea.  No constipation. Genitourinary: + for dysuria. Musculoskeletal: Negative lower extremity swelling Skin: Negative for rash. Neurological: Negative for severe headaches, focal weakness or numbness.   ____________________________________________   PHYSICAL EXAM:  VITAL SIGNS: ED  Triage Vitals [02/22/18 1057]  Enc Vitals Group     BP (!) 190/78     Pulse Rate 66     Resp 18     Temp 97.8 F (36.6 C)     Temp Source Oral     SpO2 100 %     Weight 180 lb (81.6 kg)     Height 6\' 1"  (1.854 m)     Head Circumference      Peak Flow      Pain Score 0     Pain Loc      Pain Edu?      Excl. in GC?     Constitutional: Alert and oriented. Well appearing and in no acute distress. Eyes: Conjunctivae are normal Head: Atraumatic HEENT: No congestion/rhinnorhea. Mucous membranes are moist.  Oropharynx non-erythematous Neck:   Nontender with no meningismus, no masses, no stridor Cardiovascular: Normal rate, regular rhythm. Grossly normal heart sounds.  Good peripheral circulation. Respiratory: Normal respiratory effort.  No retractions. Lungs CTAB. Abdominal: Soft and nontender. No distention. No guarding no rebound Back:  There is no focal tenderness or step off.  there is no midline tenderness there are no lesions noted. there is no CVA tenderness Musculoskeletal: No lower extremity tenderness, no upper extremity tenderness. No joint effusions, no DVT signs strong distal pulses no edema Neurologic:  Normal speech and language. No gross focal neurologic deficits are appreciated.  Skin:  Skin is warm, dry and intact. No rash noted. Psychiatric: Mood and affect are normal. Speech and behavior are normal.  ____________________________________________   LABS (all labs ordered are listed, but only abnormal results are displayed)  Labs Reviewed  CBC - Abnormal; Notable for the following components:      Result Value   RBC 3.81 (*)    Hemoglobin 12.4 (*)    HCT 36.1 (*)    All other components within normal limits  URINALYSIS, COMPLETE (UACMP) WITH MICROSCOPIC - Abnormal; Notable for the following components:   Color, Urine STRAW (*)    APPearance CLEAR (*)    Hgb urine dipstick SMALL (*)    Protein, ur 30 (*)    Leukocytes, UA SMALL (*)    Bacteria, UA RARE (*)     All other components within normal limits  GLUCOSE, CAPILLARY - Abnormal; Notable for the following components:   Glucose-Capillary 104 (*)    All other components within normal limits  COMPREHENSIVE METABOLIC PANEL - Abnormal; Notable for the following components:   Potassium 5.5 (*)    Glucose, Bld 115 (*)    BUN 41 (*)    Creatinine, Ser 1.95 (*)    Calcium 7.8 (*)    Albumin 3.2 (*)    GFR calc non Af Amer 29 (*)    GFR calc Af Amer 33 (*)    All other components within normal limits  URINE CULTURE  GLUCOSE, CAPILLARY  CBG MONITORING, ED  CBG MONITORING, ED    Pertinent labs  results  that were available during my care of the patient were reviewed by me and considered in my medical decision making (see chart for details). ____________________________________________  EKG  I personally interpreted any EKGs ordered by me or triage  ____________________________________________  RADIOLOGY  Pertinent labs & imaging results that were available during my care of the patient were reviewed by me and considered in my medical decision making (see chart for details). If possible, patient and/or family made aware of any abnormal findings.  No results found. ____________________________________________    PROCEDURES  Procedure(s) performed: None  Procedures  Critical Care performed: None  ____________________________________________   INITIAL IMPRESSION / ASSESSMENT AND PLAN / ED COURSE  Pertinent labs & imaging results that were available during my care of the patient were reviewed by me and considered in my medical decision making (see chart for details).  Patient here with hypoglycemia, he does have evidence of acute renal injury, baseline creatinine is 1.5 to at worst 1.7 and he is almost 2 today, elevated BUN as well, also UTI.  I suspect that his Amaryl is being potentiated by dehydration and renal issues, we are giving him ginger IV fluid, I will give him  antibiotics for what does appear to be a urinary tract infection with reported dysuria leukocytes and white cells, urine culture is pending.  Does not appear to be septic.  Blood sugar has trended somewhat down but we have been able to feed him and keep it up, we will admit to the hospital for further evaluation    ____________________________________________   FINAL CLINICAL IMPRESSION(S) / ED DIAGNOSES  Final diagnoses:  Lower urinary tract infectious disease  Dehydration  Hypoglycemia      This chart was dictated using voice recognition software.  Despite best efforts to proofread,  errors can occur which can change meaning.      Jeanmarie Plant, MD 02/22/18 [phone]

## 2018-02-22 NOTE — ED Notes (Signed)
Spoke with patient's son Kathlene NovemberMike, informed him that the patient would be in room 125.

## 2018-02-22 NOTE — Progress Notes (Addendum)
Notified Dr. Imogene Burnhen of patient's potassium 5.5, no new orders received.

## 2018-02-22 NOTE — ED Notes (Signed)
Spoke with patient's son [phone] updated him on patient's status and that the patient would be admitted to the hospital. Pt requesting return phone call when pt is admitted to the hospital and what room number he will be in.

## 2018-02-22 NOTE — ED Notes (Addendum)
Pt was incontinent on arrival to ED, when urine specimen obtained, pt was cleaned and given fresh brief.  BED EXIT placed under patient.

## 2018-02-22 NOTE — ED Notes (Signed)
Pt given 8oz of coke and is eating some graham crackers. Pt alert and talking, states he did not eat much supper last night and ate a small breakfast this morning. Pt states he did take his diabetes medication this morning.

## 2018-02-23 DIAGNOSIS — H9190 Unspecified hearing loss, unspecified ear: Secondary | ICD-10-CM | POA: Diagnosis present

## 2018-02-23 DIAGNOSIS — Z823 Family history of stroke: Secondary | ICD-10-CM | POA: Diagnosis not present

## 2018-02-23 DIAGNOSIS — E875 Hyperkalemia: Secondary | ICD-10-CM | POA: Diagnosis present

## 2018-02-23 DIAGNOSIS — E1122 Type 2 diabetes mellitus with diabetic chronic kidney disease: Secondary | ICD-10-CM | POA: Diagnosis present

## 2018-02-23 DIAGNOSIS — Z9842 Cataract extraction status, left eye: Secondary | ICD-10-CM | POA: Diagnosis not present

## 2018-02-23 DIAGNOSIS — E785 Hyperlipidemia, unspecified: Secondary | ICD-10-CM | POA: Diagnosis present

## 2018-02-23 DIAGNOSIS — I5022 Chronic systolic (congestive) heart failure: Secondary | ICD-10-CM | POA: Diagnosis present

## 2018-02-23 DIAGNOSIS — M1990 Unspecified osteoarthritis, unspecified site: Secondary | ICD-10-CM | POA: Diagnosis present

## 2018-02-23 DIAGNOSIS — I130 Hypertensive heart and chronic kidney disease with heart failure and stage 1 through stage 4 chronic kidney disease, or unspecified chronic kidney disease: Secondary | ICD-10-CM | POA: Diagnosis present

## 2018-02-23 DIAGNOSIS — Z7982 Long term (current) use of aspirin: Secondary | ICD-10-CM | POA: Diagnosis not present

## 2018-02-23 DIAGNOSIS — N179 Acute kidney failure, unspecified: Secondary | ICD-10-CM | POA: Diagnosis present

## 2018-02-23 DIAGNOSIS — Z961 Presence of intraocular lens: Secondary | ICD-10-CM | POA: Diagnosis present

## 2018-02-23 DIAGNOSIS — J449 Chronic obstructive pulmonary disease, unspecified: Secondary | ICD-10-CM | POA: Diagnosis present

## 2018-02-23 DIAGNOSIS — Z8249 Family history of ischemic heart disease and other diseases of the circulatory system: Secondary | ICD-10-CM | POA: Diagnosis not present

## 2018-02-23 DIAGNOSIS — Z951 Presence of aortocoronary bypass graft: Secondary | ICD-10-CM | POA: Diagnosis not present

## 2018-02-23 DIAGNOSIS — N183 Chronic kidney disease, stage 3 (moderate): Secondary | ICD-10-CM | POA: Diagnosis present

## 2018-02-23 DIAGNOSIS — N400 Enlarged prostate without lower urinary tract symptoms: Secondary | ICD-10-CM | POA: Diagnosis present

## 2018-02-23 DIAGNOSIS — Z7984 Long term (current) use of oral hypoglycemic drugs: Secondary | ICD-10-CM | POA: Diagnosis not present

## 2018-02-23 DIAGNOSIS — I429 Cardiomyopathy, unspecified: Secondary | ICD-10-CM | POA: Diagnosis present

## 2018-02-23 DIAGNOSIS — Z888 Allergy status to other drugs, medicaments and biological substances status: Secondary | ICD-10-CM | POA: Diagnosis not present

## 2018-02-23 DIAGNOSIS — N390 Urinary tract infection, site not specified: Secondary | ICD-10-CM | POA: Diagnosis present

## 2018-02-23 DIAGNOSIS — E860 Dehydration: Secondary | ICD-10-CM | POA: Diagnosis present

## 2018-02-23 DIAGNOSIS — E11649 Type 2 diabetes mellitus with hypoglycemia without coma: Secondary | ICD-10-CM | POA: Diagnosis present

## 2018-02-23 DIAGNOSIS — F329 Major depressive disorder, single episode, unspecified: Secondary | ICD-10-CM | POA: Diagnosis present

## 2018-02-23 LAB — BASIC METABOLIC PANEL
ANION GAP: 4 — AB (ref 5–15)
BUN: 41 mg/dL — ABNORMAL HIGH (ref 8–23)
CALCIUM: 7.9 mg/dL — AB (ref 8.9–10.3)
CO2: 28 mmol/L (ref 22–32)
CREATININE: 1.81 mg/dL — AB (ref 0.61–1.24)
Chloride: 104 mmol/L (ref 98–111)
GFR calc non Af Amer: 31 mL/min — ABNORMAL LOW (ref >60)
GFR, EST AFRICAN AMERICAN: 36 mL/min — AB (ref >60)
Glucose, Bld: 73 mg/dL (ref 70–99)
Potassium: 5.5 mmol/L — ABNORMAL HIGH (ref 3.5–5.1)
SODIUM: 136 mmol/L (ref 135–145)

## 2018-02-23 LAB — CBC
HCT: 34.1 % — ABNORMAL LOW (ref 40.0–52.0)
Hemoglobin: 11.8 g/dL — ABNORMAL LOW (ref 13.0–18.0)
MCH: 32.7 pg (ref 26.0–34.0)
MCHC: 34.5 g/dL (ref 32.0–36.0)
MCV: 94.5 fL (ref 80.0–100.0)
PLATELETS: 203 10*3/uL (ref 150–440)
RBC: 3.6 MIL/uL — AB (ref 4.40–5.90)
RDW: 13.4 % (ref 11.5–14.5)
WBC: 6.6 10*3/uL (ref 3.8–10.6)

## 2018-02-23 LAB — GLUCOSE, CAPILLARY
GLUCOSE-CAPILLARY: 49 mg/dL — AB (ref 70–99)
GLUCOSE-CAPILLARY: 73 mg/dL (ref 70–99)
GLUCOSE-CAPILLARY: 62 mg/dL — AB (ref 70–99)
GLUCOSE-CAPILLARY: 85 mg/dL (ref 70–99)
GLUCOSE-CAPILLARY: 98 mg/dL (ref 70–99)
Glucose-Capillary: 92 mg/dL (ref 70–99)
Glucose-Capillary: 176 mg/dL — ABNORMAL HIGH (ref 70–99)
Glucose-Capillary: 100 mg/dL — ABNORMAL HIGH (ref 70–99)

## 2018-02-23 MED ORDER — DEXTROSE 50 % IV SOLN
25.0000 mL | Freq: Once | INTRAVENOUS | Status: AC
  Administered 2018-02-23: 25 mL via INTRAVENOUS
  Filled 2018-02-23: qty 50

## 2018-02-23 MED ORDER — INSULIN ASPART 100 UNIT/ML IV SOLN
10.0000 [IU] | Freq: Once | INTRAVENOUS | Status: AC
  Administered 2018-02-23: 10 [IU] via INTRAVENOUS
  Filled 2018-02-23 (×2): qty 0.1

## 2018-02-23 MED ORDER — DEXTROSE 50 % IV SOLN
25.0000 mL | Freq: Once | INTRAVENOUS | Status: AC
  Administered 2018-02-23: 09:00:00 25 mL via INTRAVENOUS
  Filled 2018-02-23: qty 50

## 2018-02-23 MED ORDER — SODIUM POLYSTYRENE SULFONATE 15 GM/60ML PO SUSP
30.0000 g | Freq: Once | ORAL | Status: AC
  Administered 2018-02-23: 30 g via ORAL
  Filled 2018-02-23: qty 120

## 2018-02-23 MED ORDER — DEXTROSE-NACL 5-0.45 % IV SOLN
INTRAVENOUS | Status: AC
  Administered 2018-02-23: 02:00:00 via INTRAVENOUS

## 2018-02-23 MED ORDER — RISPERIDONE 1 MG PO TBDP
0.5000 mg | ORAL_TABLET | Freq: Two times a day (BID) | ORAL | Status: DC | PRN
  Administered 2018-02-23: 23:00:00 0.5 mg via ORAL
  Filled 2018-02-23 (×2): qty 0.5

## 2018-02-23 NOTE — Plan of Care (Signed)
  Problem: Education: °Goal: Knowledge of General Education information will improve °Description: Including pain rating scale, medication(s)/side effects and non-pharmacologic comfort measures °Outcome: Progressing °  °Problem: Safety: °Goal: Ability to remain free from injury will improve °Outcome: Progressing °  °Problem: Urinary Elimination: °Goal: Signs and symptoms of infection will decrease °Outcome: Progressing °  °

## 2018-02-23 NOTE — Progress Notes (Signed)
[facility] [place] [facility]   PATIENT NAME: Fred Watson    MR#:  [id number]  DATE OF BIRTH:  May 07, 1927  SUBJECTIVE:   Patient with some confusion this am  Hard of hearing   REVIEW OF SYSTEMS:    confused   Tolerating Diet: yes      DRUG ALLERGIES:   Allergies  Allergen Reactions  . Ezetimibe Other (See Comments)  . Niacin Other (See Comments)    Flushing  . Ace Inhibitors Cough    Tolerating lisinopril 20 mg without side effects 02/01/2015  . Atorvastatin Other (See Comments)    Nightmares  . Losartan Other (See Comments)    VITALS:  Blood pressure (!) 167/62, pulse 60, temperature 98.8 F (37.1 C), temperature source Oral, resp. rate 17, height 6\' 1"  (1.854 m), weight 81.6 kg, SpO2 99 %.  PHYSICAL EXAMINATION:  Constitutional: Appears well-developed and well-nourished. No distress. HENT: Normocephalic. Marland Kitchen. Oropharynx is clear and moist.  Eyes: Conjunctivae and EOM are normal. PERRLA, no scleral icterus.  Neck: Normal ROM. Neck supple. No JVD. No tracheal deviation. CVS: RRR, S1/S2 +, 3/6 murmur, no gallops, no carotid bruit.  Pulmonary: Effort and breath sounds normal, no stridor, rhonchi, wheezes, rales.  Abdominal: Soft. BS +,  no distension, tenderness, rebound or guarding.  Musculoskeletal: Normal range of motion. No edema and no tenderness.  Neuro: Alert. CN 2-12 grossly intact. No focal deficits.oriented to name not place or time Skin: Skin is warm and dry. No rash noted. Psychiatric: Confused     LABORATORY PANEL:   CBC Recent Labs  Lab 02/23/18 0524  WBC 6.6  HGB 11.8*  HCT 34.1*  PLT 203   ------------------------------------------------------------------------------------------------------------------  Chemistries  Recent Labs  Lab 02/22/18 1131  02/23/18 0524  NA 135   < > 136  K 5.5*   < > 5.5*  CL 100   < > 104  CO2 29   < > 28  GLUCOSE 115*   < > 73  BUN 41*   < > 41*  CREATININE 1.95*   < > 1.81*   CALCIUM 7.8*   < > 7.9*  AST 15  --   --   ALT 13  --   --   ALKPHOS 92  --   --   BILITOT 0.8  --   --    < > = values in this interval not displayed.   ------------------------------------------------------------------------------------------------------------------  Cardiac Enzymes No results for input(s): TROPONINI in the last 168 hours. ------------------------------------------------------------------------------------------------------------------  RADIOLOGY:  No results found.   ASSESSMENT AND PLAN:   84 year old male with chronic systolic heart failure ejection fraction of 30%, diabetes and COPD who presents to the emergency room due to confusion and found to have low blood sugar.   1.  Hypoglycemia due to acute kidney injury in the setting of oral diabetic medications Stop Amaryl Continue D51/2 NS Follow blood sugars   2.  Hyperkalemia: Kayexalate, insulin and dextrose Repeat in a.m.  3.  BPH: Continue Proscar  4.  Depression: Continue Prozac  5.  Chronic systolic heart failure ejection fraction 30% without signs of exacerbation Continue metoprolol Holding Lasix due to acute kidney injury but may resume in a.m. if creatinine remains stable.  6.  Essential hypertension: Continue metoprolol Holding lisinopril due to acute kidney injury  7.  Acute kidney injury with chronic kidney disease stage III: Creatinine near baseline Hold nephrotoxic medications  8.  Hyperlipidemia: Continue Pravachol   Physical therapy consultation  requested for discharge planning   Management plans discussed with nurisng CODE STATUS: FULL  TOTAL TIME TAKING CARE OF THIS PATIENT: 28 minutes.     POSSIBLE D/C 1-2 days, DEPENDING ON CLINICAL CONDITION.   Ting Cage M.D on 02/23/2018 at 9:09 AM  Between 7am to 6pm - Pager - [phone] After 6pm go to [email or web address] - Social research officer, government  [facility]  Office  [phone]  CC: Primary care physician;  Mickey Farber, MD  Note: This dictation was prepared with Dragon dictation along with smaller phrase technology. Any transcriptional errors that result from this process are unintentional.

## 2018-02-23 NOTE — Progress Notes (Signed)
PT Cancellation Note  Patient Details Name: Fred Watson MRN: [id number] DOB: [birth year]   Cancelled Treatment:    Reason Eval/Treat Not Completed: Patient not medically ready. Per chart review, patient's K+ is out of the normative range for treatment (elevated at 5.5). Will check back when patient is medically appropriate for his PT evaluation.   Catalina LungerPatricia Puneet Selden, PT, DPT, CWCE  Katherina Rightatricia P Terika Pillard 02/23/2018, 1:11 PM

## 2018-02-23 NOTE — Care Management Obs Status (Signed)
MEDICARE OBSERVATION STATUS NOTIFICATION   Patient Details  Name: Fred Watson MRN: [id number] Date of Birth: 1927/05/29   Medicare Observation Status Notification Given:  Yes    Emary Zalar A Taiesha Bovard, RN 02/23/2018, 9:39 AM

## 2018-02-24 LAB — URINE CULTURE: Culture: >=100000 — AB

## 2018-02-24 LAB — BASIC METABOLIC PANEL
Anion gap: 3 — ABNORMAL LOW (ref 5–15)
BUN: 33 mg/dL — AB (ref 8–23)
CALCIUM: 7.3 mg/dL — AB (ref 8.9–10.3)
CO2: 29 mmol/L (ref 22–32)
CREATININE: 1.51 mg/dL — AB (ref 0.61–1.24)
Chloride: 103 mmol/L (ref 98–111)
GFR calc Af Amer: 45 mL/min — ABNORMAL LOW (ref >60)
GFR calc non Af Amer: 39 mL/min — ABNORMAL LOW (ref >60)
GLUCOSE: 121 mg/dL — AB (ref 70–99)
Potassium: 4.3 mmol/L (ref 3.5–5.1)
Sodium: 135 mmol/L (ref 135–145)

## 2018-02-24 LAB — GLUCOSE, CAPILLARY
GLUCOSE-CAPILLARY: 98 mg/dL (ref 70–99)
GLUCOSE-CAPILLARY: 140 mg/dL — AB (ref 70–99)

## 2018-02-24 MED ORDER — GLIMEPIRIDE 2 MG PO TABS: 2.0000 mg | ORAL_TABLET | Freq: Every day | ORAL | 1 refills | Status: AC

## 2018-02-24 MED ORDER — CEFUROXIME AXETIL 250 MG PO TABS: 250.0000 mg | ORAL_TABLET | Freq: Two times a day (BID) | ORAL | 0 refills | Status: AC

## 2018-02-24 NOTE — Evaluation (Addendum)
Physical Therapy Evaluation Patient Details Name: AGAM DAVENPORT MRN: [id number] DOB: [birth year] Today's Date: 02/24/2018   History of Present Illness  Pt is a 84 y.o. male that was addmitted to hospital and dx with hypoglycemia and c/c of altered mental status. In ED patient was found to have UTI. Pt PMHG includes CHF, COPD, CAD, HTN, DM, hyperlipidemia, and depression.  Clinical Impression  Pt is a pleasant 84 year old male who was admitted for hypoglycemia and c/c of altered mental status with PMH listed above. Pt believes his altered mental status has improved since admission but he is disoriented to time stating date is 77. Pt denied any dizziness or lightheadedness throughout session and rates RPE as MOD-HARD with activity. Pt performs bed mobility with supervision, transfers sit<>stand with Min A, and ambulates 5' to recliner with CGA/RW. See below for mobility specifics. Pt demonstrates deficits with functional mobility 2/2 weakness, fatigue, poor sequencing, and decreased understanding of RW usage. Pt Would benefit from skilled PT to address above deficits and promote optimal return to PLOF. PT recommends d/c to home with home health PT 2/2 pt functioning at or near baseline and pt having 24/7 family assistance at home.   Follow Up Recommendations Home health PT    Equipment Recommendations  None recommended by PT    Recommendations for Other Services OT consult     Precautions / Restrictions Precautions Precautions: Fall Restrictions Weight Bearing Restrictions: No      Mobility  Bed Mobility Overal bed mobility: Needs Assistance Bed Mobility: Supine to Sit     Supine to sit: Supervision     General bed mobility comments: Pt benefited from supervision for verbal cueing to improve sequencing.  Transfers Overall transfer level: Needs assistance Equipment used: Rolling walker (2 wheeled) Transfers: Sit to/from Stand Sit to Stand: Min assist         General  transfer comment: Pt required Min A at hips and R anterior knee 2/2 weakness. Pt benefited from multimodal cueing for appropriate use of RW and improve sequencing. Pt needed multiple attempts and does better from elevated sufaces.  Ambulation/Gait Ambulation/Gait assistance: Min guard Gait Distance (Feet): 5 Feet Assistive device: Rolling walker (2 wheeled) Gait Pattern/deviations: Step-to pattern;Wide base of support     General Gait Details: Pt required CGA with multimodal cueing for safety to ambulate 5' to recliner. Pt had difficulty utilizing RW and sequencing R LE, but did improve with PT cueing. Pt very limited in ability to step backwards.  Stairs            Wheelchair Mobility    Modified Rankin (Stroke Patients Only)       Balance Overall balance assessment: Needs assistance Sitting-balance support: No upper extremity supported;Feet supported Sitting balance-Leahy Scale: Fair     Standing balance support: No upper extremity supported Standing balance-Leahy Scale: Fair Standing balance comment: pt able to remove both hands from walker to use urinal with PT CGA for unsteadiness.                             Pertinent Vitals/Pain Pain Assessment: No/denies pain    Home Living Family/patient expects to be discharged to:: Private residence Living Arrangements: Children;Other relatives Available Help at Discharge: (P) Family;Available 24 hours/day Type of Home: House Home Access: Ramped entrance     Home Layout: One level Home Equipment: Walker - 4 wheels;Cane - single point      Prior Function Level  of Independence: Needs assistance   Gait / Transfers Assistance Needed: Mod I with 4WW or cane in home  ADL's / Homemaking Assistance Needed: Pt able to manage most ADLs but required assistance with showering, cooking, transportation, and hous keeping.  Comments: (P) Pt states he has needed more assistance in the last year. Will order OT evaluation  for pt stated ADL difficulty.     Hand Dominance   Dominant Hand: Right    Extremity/Trunk Assessment   Upper Extremity Assessment Upper Extremity Assessment: Generalized weakness(grossly 3+/5 MMT R<L)    Lower Extremity Assessment Lower Extremity Assessment: Generalized weakness(grossly 3/5 MMT)    Cervical / Trunk Assessment Cervical / Trunk Assessment: Kyphotic  Communication   Communication: HOH  Cognition Arousal/Alertness: Awake/alert Behavior During Therapy: WFL for tasks assessed/performed Overall Cognitive Status: Impaired/Different from baseline Area of Impairment: Orientation                 Orientation Level: Disoriented to;Time             General Comments: Pt states year is 1980 but otherwise oriented. Pt able to answer most question despite HOH, and follows two step commands well.      General Comments      Exercises Other Exercises Other Exercises: (P) PT provided CGA and cueing for standing EOB and using urinal. Pt able to tolerate 3 min of continaul standing with RPE moderate. PT noted increased sway, and advised pt to utilize UEs on RW.   Assessment/Plan    PT Assessment Patient needs continued PT services  PT Problem List Decreased strength;Decreased range of motion;Decreased activity tolerance;Decreased mobility;Decreased balance;Decreased coordination;Decreased knowledge of use of DME;Decreased safety awareness       PT Treatment Interventions DME instruction;Gait training;Functional mobility training;Therapeutic activities;Therapeutic exercise;Balance training;Neuromuscular re-education;Patient/family education    PT Goals (Current goals can be found in the Care Plan section)  Acute Rehab PT Goals Patient Stated Goal: go home PT Goal Formulation: With patient Time For Goal Achievement: 03/10/18 Potential to Achieve Goals: Fair    Frequency Min 2X/week   Barriers to discharge        Co-evaluation                AM-PAC PT "6 Clicks" Daily Activity  Outcome Measure Difficulty turning over in bed (including adjusting bedclothes, sheets and blankets)?: A Lot Difficulty moving from lying on back to sitting on the side of the bed? : A Little Difficulty sitting down on and standing up from a chair with arms (e.g., wheelchair, bedside commode, etc,.)?: Unable Help needed moving to and from a bed to chair (including a wheelchair)?: A Little Help needed walking in hospital room?: A Lot Help needed climbing 3-5 steps with a railing? : A Lot 6 Click Score: 13    End of Session Equipment Utilized During Treatment: Gait belt Activity Tolerance: Patient limited by fatigue Patient left: in chair;with call bell/phone within reach;with chair alarm set Nurse Communication: Mobility status;Other (comment)(urinal status) PT Visit Diagnosis: Unsteadiness on feet (R26.81);Other abnormalities of gait and mobility (R26.89);History of falling (Z91.81);Muscle weakness (generalized) (M62.81);Difficulty in walking, not elsewhere classified (R26.2)    Time: [id number] PT Time Calculation (min) (ACUTE ONLY): 30 min   Charges:              Renaldo HarrisonStephen Ceirra Belli, SPT   Renaldo HarrisonStephen Cagney Degrace 02/24/2018, 10:40 AM

## 2018-02-24 NOTE — Care Management Note (Signed)
Case Management Note  Patient Details  Name: Fred Watson MRN: [id number] Date of Birth: 12/17/1926  Subjective/Objective:   Patient will need home health services at discharge. He lives with his son and great grand son who cares for him during the day. Spoke with son, Kathlene NovemberMike. Offered a list of home health agencies. He has never had home health and has no agency preference. Referral to [facility] for RN, PT and OT. No DME needed. Patient has a walker at home. PCP is Dr. Harrington [facility].                  Action/Plan:   Expected Discharge Date:  02/24/18               Expected Discharge Plan:  Home w Home Health Services  In-House Referral:     Discharge planning Services  CM Consult  Post Acute Care Choice:  Home Health Choice offered to:     DME Arranged:    DME Agency:     HH Arranged:  RN, PT, OT HH Agency:  [facility]  Status of Service:  Completed, signed off  If discussed at [employer] Meetings, dates discussed:    Additional Comments:  Marily MemosLisa M Leani Myron, RN 02/24/2018, 3:53 PM

## 2018-02-24 NOTE — Evaluation (Signed)
Occupational Therapy Evaluation Patient Details Name: Fred Watson MRN: [id number] DOB: 06/09/1927 Today's Date: 02/24/2018    History of Present Illness Pt is a 84 y.o. male that was addmitted to hospital and dx with hypoglycemia and c/c of altered mental status. In ED patient was found to have UTI. Pt PMHG includes CHF, COPD, CAD, HTN, DM, hyperlipidemia, and depression.   Clinical Impression   Pt is 84 year old male who presents with hypoglycemia, AMS and has a UTI.  He lives at home with his son Fred Watson and his great grandson Fred Watson who is with him most of the day as his caregiver.  Pt presents with a flat affect, HOH, with a slight delay in responses but cooperative. Pt has decreased functional endurance for ADLs.  He is able to use BUE and hands for ADLs but needs structure and cueing for sequencing and has impaired safety awareness and problem solving skills.  Tasks requiring planning are difficult.  He has grab bars in his walk in shower and needs assist to safely get into and out of shower but is able to use HH shower after set up and cues.  He could benefit from elastic shoe laces, LH shoe horn and non-slip shelving mat under rug by bed and shower stall since it tends to not stay in place per son's report.  Rec continued OT while in hospital to continue to work on increasing independence in ADLs, balance and functional mobility training, coordination exercises and family ed and training and OT [facility] after discharge.    Follow Up Recommendations  Home health OT    Equipment Recommendations  (rec having a gait belt at home to help with transfers and balance during ADLs)    Recommendations for Other Services       Precautions / Restrictions Precautions Precautions: Fall Restrictions Weight Bearing Restrictions: No Other Position/Activity Restrictions: HOH      Mobility Bed Mobility                  Transfers                      Balance                                            ADL either performed or assessed with clinical judgement   ADL Overall ADL's : Needs assistance/impaired Eating/Feeding: Independent;Set up   Grooming: Wash/dry hands;Wash/dry face;Oral care;Set up;Supervision/safety;Cueing for sequencing Grooming Details (indicate cue type and reason): needs extra time and assist for set up to complete Upper Body Bathing: Independent;Set up Upper Body Bathing Details (indicate cue type and reason): extra time needed Lower Body Bathing: Minimal assistance;Set up   Upper Body Dressing : Independent;Set up   Lower Body Dressing: Minimal assistance;Set up Lower Body Dressing Details (indicate cue type and reason): sitting EOB and needs assist for putting socks and shoes on which was PLOF prior to admission---rec elastic shoe laces and good body mechanics for caregiver Fred Watson when helping him                     Vision Patient Visual Report: No change from baseline       Perception     Praxis      Pertinent Vitals/Pain Pain Assessment: No/denies pain     Hand Dominance Right   Extremity/Trunk Assessment  Upper Extremity Assessment Upper Extremity Assessment: Generalized weakness   Lower Extremity Assessment Lower Extremity Assessment: Defer to PT evaluation   Cervical / Trunk Assessment Cervical / Trunk Assessment: Kyphotic   Communication Communication Communication: HOH   Cognition Arousal/Alertness: Awake/alert Behavior During Therapy: WFL for tasks assessed/performed Overall Cognitive Status: History of cognitive impairments - at baseline Area of Impairment: Orientation                 Orientation Level: Disoriented to;Time             General Comments: Pt states year is 1980 but otherwise oriented. When given choice of years, he was able to recognize 2019 as current year. Pt able to answer most question despite HOH, and follows two step commands well.   General Comments        Exercises     Shoulder Instructions      Home Living Family/patient expects to be discharged to:: Private residence Living Arrangements: Children;Other relatives Available Help at Discharge: Family;Available 24 hours/day Type of Home: House Home Access: Ramped entrance     Home Layout: One level     Bathroom Shower/Tub: Producer, television/film/videoWalk-in shower   Bathroom Toilet: Standard Bathroom Accessibility: Yes   Home Equipment: Environmental consultantWalker - 4 wheels;Cane - single point;Shower seat - built in;Hand held Careers information officershower head;Walker - 2 wheels          Prior Functioning/Environment Level of Independence: Needs assistance  Gait / Transfers Assistance Needed: Mod I with 4WW or cane in home ADL's / Homemaking Assistance Needed: Pt able to manage most ADLs but required assistance with showering, cooking, transportation, and house keeping.  Supervision at all times due to decreased cognition and decreased safety awareness.            OT Problem List: Decreased strength;Decreased activity tolerance;Decreased safety awareness;Decreased cognition      OT Treatment/Interventions: Self-care/ADL training;Therapeutic activities;Cognitive remediation/compensation;Patient/family education    OT Goals(Current goals can be found in the care plan section) Acute Rehab OT Goals Patient Stated Goal: go home OT Goal Formulation: With patient/family Time For Goal Achievement: 03/10/18 Potential to Achieve Goals: Good ADL Goals Pt Will Perform Lower Body Dressing: with set-up;sit to/from stand;with min guard assist Pt Will Transfer to Toilet: with set-up;stand pivot transfer;regular height toilet;with min guard assist Pt Will Perform Toileting - Clothing Manipulation and hygiene: with min guard assist;sit to/from stand  OT Frequency: Min 1X/week   Barriers to D/C:            Co-evaluation              AM-PAC PT "6 Clicks" Daily Activity     Outcome Measure Help from another person eating meals?: None Help  from another person taking care of personal grooming?: None Help from another person toileting, which includes using toliet, bedpan, or urinal?: A Little Help from another person bathing (including washing, rinsing, drying)?: A Little Help from another person to put on and taking off regular upper body clothing?: None Help from another person to put on and taking off regular lower body clothing?: A Little 6 Click Score: 21   End of Session    Activity Tolerance: Patient tolerated treatment well Patient left: in bed;with call bell/phone within reach;with bed alarm set;with family/visitor present  OT Visit Diagnosis: Unsteadiness on feet (R26.81);Muscle weakness (generalized) (M62.81)                Time: 1400-1440 OT Time Calculation (min): 40 min Charges:  OT General Charges $  OT Visit: 1 Visit OT Evaluation $OT Eval Low Complexity: 1 Low OT Treatments $Self Care/Home Management : 23-37 mins  Susanne Borders, OTR/L ascom [phone] 02/24/18, 2:58 PM

## 2018-02-24 NOTE — Discharge Summary (Signed)
[facility] [place] [facility]   PATIENT NAME: Fred Watson    MR#:  [id number]  DATE OF BIRTH:  07-14-1926  DATE OF ADMISSION:  02/22/2018 ADMITTING PHYSICIAN: Shaune Pollack, MD  DATE OF DISCHARGE: 02/24/2018  PRIMARY CARE PHYSICIAN: Mickey Farber, MD    ADMISSION DIAGNOSIS:  Dehydration [E86.0] Lower urinary tract infectious disease [N39.0] Hypoglycemia [E16.2]  DISCHARGE DIAGNOSIS:  Active Problems:   Hypoglycemia   SECONDARY DIAGNOSIS:   Past Medical History:  Diagnosis Date  . Arthritis   . Cardiomyopathy (HCC)   . CHF (congestive heart failure) (HCC)   . COPD (chronic obstructive pulmonary disease) (HCC)   . Coronary artery disease   . Depression   . Diabetes mellitus without complication (HCC)   . Hearing loss   . Hyperlipidemia   . Hypertension   . Macular degeneration     HOSPITAL COURSE:   84 year old male with past medical history of diabetes, hypertension, hyperlipidemia, coronary artery disease, COPD, CHF, osteoarthritis who presented to the hospital due to altered mental status and noted to be hypoglycemic.  1.  Altered mental status-this was metabolic encephalopathy secondary to underlying hypoglycemia and also underlying urinary tract infection.  Patient's glimepiride was held he was given dextrose, his urinary tract infection treated with IV Rocephin.  His mental status much improved now is back to baseline.  2.  Hypoglycemia- secondary to underlying UTI/poor p.o. intake and acute kidney injury.  Blood sugars have improved with dextrose infusion and's patient's diabetic meds were held. - Patient's Amaryl dose has been cut in half.  3.  Urinary tract infection-patient was treated with IV ceftriaxone, urine cultures were positive for Citrobacter and its pansensitive and patient now being discharged on oral Ceftin.  4.  Acute kidney injury- secondary to mild dehydration, patient was hydrated with IV fluids and his creatinine is improved  and is close to baseline now.  5.  History of coronary artery disease-patient had no acute chest pain.  Patient will continue his aspirin, beta-blocker, statin as mentioned below.  6.  Depression-patient will continue his Prozac.  7.  History of depression-patient will continue his Prozac.  Patient was seen by physical therapy and the recommended home health services and patient is being arranged for home health physical therapy and nursing.  DISCHARGE CONDITIONS:   Stable  CONSULTS OBTAINED:    DRUG ALLERGIES:   Allergies  Allergen Reactions  . Ezetimibe Other (See Comments)  . Niacin Other (See Comments)    Flushing  . Ace Inhibitors Cough    Tolerating lisinopril 20 mg without side effects 02/01/2015  . Atorvastatin Other (See Comments)    Nightmares  . Losartan Other (See Comments)    DISCHARGE MEDICATIONS:   Allergies as of 02/24/2018      Reactions   Ezetimibe Other (See Comments)   Niacin Other (See Comments)   Flushing   Ace Inhibitors Cough   Tolerating lisinopril 20 mg without side effects 02/01/2015   Atorvastatin Other (See Comments)   Nightmares   Losartan Other (See Comments)      Medication List    TAKE these medications   aspirin EC 81 MG tablet Take 81 mg by mouth daily.   cefUROXime 250 MG tablet Commonly known as:  CEFTIN Take 1 tablet (250 mg total) by mouth 2 (two) times daily with a meal for 3 days.   cholecalciferol 1000 units tablet Commonly known as:  VITAMIN D Take 1,000 Units by mouth daily.   docusate sodium  100 MG capsule Commonly known as:  COLACE Take 100 mg by mouth 2 (two) times daily.   finasteride 5 MG tablet Commonly known as:  PROSCAR Take 5 mg by mouth daily.   FLUoxetine 10 MG tablet Commonly known as:  PROZAC Take 10 mg by mouth daily.   gabapentin 300 MG capsule Commonly known as:  NEURONTIN Take 300 mg by mouth 2 (two) times daily.   glimepiride 2 MG tablet Commonly known as:  AMARYL Take 1 tablet (2 mg  total) by mouth daily with breakfast. What changed:    medication strength  how much to take   lisinopril 20 MG tablet Commonly known as:  PRINIVIL,ZESTRIL Take 20 mg by mouth daily.   metoprolol succinate 100 MG 24 hr tablet Commonly known as:  TOPROL-XL Take 100 mg by mouth daily. Take with or immediately following a meal.   pravastatin 40 MG tablet Commonly known as:  PRAVACHOL Take 40 mg by mouth daily.         DISCHARGE INSTRUCTIONS:   DIET:  Cardiac diet and Diabetic diet  DISCHARGE CONDITION:  Stable  ACTIVITY:  Activity as tolerated  OXYGEN:  Home Oxygen: No.   Oxygen Delivery: room air  DISCHARGE LOCATION:  Home with Home health Nursing/PT.    If you experience worsening of your admission symptoms, develop shortness of breath, life threatening emergency, suicidal or homicidal thoughts you must seek medical attention immediately by calling 911 or calling your MD immediately  if symptoms less severe.  You Must read complete instructions/literature along with all the possible adverse reactions/side effects for all the Medicines you take and that have been prescribed to you. Take any new Medicines after you have completely understood and accpet all the possible adverse reactions/side effects.   Please note  You were cared for by a hospitalist during your hospital stay. If you have any questions about your discharge medications or the care you received while you were in the hospital after you are discharged, you can call the unit and asked to speak with the hospitalist on call if the hospitalist that took care of you is not available. Once you are discharged, your primary care physician will handle any further medical issues. Please note that NO REFILLS for any discharge medications will be authorized once you are discharged, as it is imperative that you return to your primary care physician (or establish a relationship with a primary care physician if you do not  have one) for your aftercare needs so that they can reassess your need for medications and monitor your lab values.     Today   Pt. Sitting in chair and feels better. No acute complaints or events overnight. Mental status improved. BS stable. Will d/c home with Home Health today.   VITAL SIGNS:  Blood pressure (!) 176/64, pulse 60, temperature 97.7 F (36.5 C), temperature source Oral, resp. rate 20, height 6\' 1"  (1.854 m), weight 81.6 kg, SpO2 98 %.  I/O:    Intake/Output Summary (Last 24 hours) at 02/24/2018 1353 Last data filed at 02/24/2018 1350 Gross per 24 hour  Intake 1827 ml  Output 850 ml  Net 977 ml    PHYSICAL EXAMINATION:  GENERAL:  84 y.o.-year-old patient sitting up in chair in NAD.   EYES: Pupils equal, round, reactive to light and accommodation. No scleral icterus. Extraocular muscles intact.  HEENT: Head atraumatic, normocephalic. Oropharynx and nasopharynx clear.  NECK:  Supple, no jugular venous distention. No thyroid enlargement, no tenderness.  LUNGS: Normal breath sounds bilaterally, no wheezing, rales,rhonchi. No use of accessory muscles of respiration.  CARDIOVASCULAR: S1, S2 normal. No murmurs, rubs, or gallops.  ABDOMEN: Soft, non-tender, non-distended. Bowel sounds present. No organomegaly or mass.  EXTREMITIES: No pedal edema, cyanosis, or clubbing.  NEUROLOGIC: Cranial nerves II through XII are intact. No focal motor or sensory defecits b/l. Globally weak PSYCHIATRIC: The patient is alert and oriented x 3. SKIN: No obvious rash, lesion, or ulcer.   DATA REVIEW:   CBC Recent Labs  Lab 02/23/18 0524  WBC 6.6  HGB 11.8*  HCT 34.1*  PLT 203    Chemistries  Recent Labs  Lab 02/22/18 1131  02/24/18 0332  NA 135   < > 135  K 5.5*   < > 4.3  CL 100   < > 103  CO2 29   < > 29  GLUCOSE 115*   < > 121*  BUN 41*   < > 33*  CREATININE 1.95*   < > 1.51*  CALCIUM 7.8*   < > 7.3*  AST 15  --   --   ALT 13  --   --   ALKPHOS 92  --   --    BILITOT 0.8  --   --    < > = values in this interval not displayed.    Cardiac Enzymes No results for input(s): TROPONINI in the last 168 hours.  Microbiology Results  Results for orders placed or performed during the hospital encounter of 02/22/18  Urine culture     Status: Abnormal   Collection Time: 02/22/18 11:10 AM  Result Value Ref Range Status   Specimen Description   Final    URINE, RANDOM Performed at [facility], [address] [place], [place] [address]    Special Requests   Final    NONE Performed at [facility], [address] [place], [place] [address]    Culture >=100,000 COLONIES/mL CITROBACTER KOSERI (A)  Final   Report Status 02/24/2018 FINAL  Final   Organism ID, Bacteria CITROBACTER KOSERI (A)  Final      Susceptibility   Citrobacter koseri - MIC*    CEFAZOLIN <=4 SENSITIVE Sensitive     CEFTRIAXONE <=1 SENSITIVE Sensitive     CIPROFLOXACIN <=0.25 SENSITIVE Sensitive     GENTAMICIN <=1 SENSITIVE Sensitive     IMIPENEM <=0.25 SENSITIVE Sensitive     NITROFURANTOIN 32 SENSITIVE Sensitive     TRIMETH/SULFA <=20 SENSITIVE Sensitive     PIP/TAZO <=4 SENSITIVE Sensitive     * >=100,000 COLONIES/mL CITROBACTER KOSERI    RADIOLOGY:  No results found.    Management plans discussed with the patient, family and they are in agreement.  CODE STATUS:     Code Status Orders  (From admission, onward)         Start     Ordered   02/22/18 1444  Full code  Continuous     02/22/18 1443        Code Status History    This patient has a current code status but no historical code status.      TOTAL TIME TAKING CARE OF THIS PATIENT: 40 minutes.    Houston SirenSAINANI,Donyelle Enyeart J M.D on 02/24/2018 at 1:53 PM  Between 7am to 6pm - Pager - [phone]  After 6pm go to [email or web address] - Scientist, research (life sciences)password [email or web address]  [facility]  Office  [phone]  CC: Primary care physician; Mickey Farberhies, David, MD

## 2018-04-05 ENCOUNTER — Inpatient Hospital Stay
Admission: EM | Admit: 2018-04-05 | Discharge: 2018-05-02 | DRG: 871 | Disposition: E | Payer: Medicare Other | Attending: Internal Medicine | Admitting: Internal Medicine

## 2018-04-05 ENCOUNTER — Other Ambulatory Visit: Payer: Self-pay

## 2018-04-05 ENCOUNTER — Emergency Department: Payer: Medicare Other

## 2018-04-05 DIAGNOSIS — J9622 Acute and chronic respiratory failure with hypercapnia: Secondary | ICD-10-CM | POA: Diagnosis not present

## 2018-04-05 DIAGNOSIS — Z961 Presence of intraocular lens: Secondary | ICD-10-CM | POA: Diagnosis present

## 2018-04-05 DIAGNOSIS — N390 Urinary tract infection, site not specified: Secondary | ICD-10-CM | POA: Diagnosis present

## 2018-04-05 DIAGNOSIS — N400 Enlarged prostate without lower urinary tract symptoms: Secondary | ICD-10-CM | POA: Diagnosis present

## 2018-04-05 DIAGNOSIS — I5043 Acute on chronic combined systolic (congestive) and diastolic (congestive) heart failure: Secondary | ICD-10-CM | POA: Diagnosis present

## 2018-04-05 DIAGNOSIS — H9190 Unspecified hearing loss, unspecified ear: Secondary | ICD-10-CM | POA: Diagnosis present

## 2018-04-05 DIAGNOSIS — R0602 Shortness of breath: Secondary | ICD-10-CM | POA: Diagnosis not present

## 2018-04-05 DIAGNOSIS — N183 Chronic kidney disease, stage 3 (moderate): Secondary | ICD-10-CM | POA: Diagnosis present

## 2018-04-05 DIAGNOSIS — Z7982 Long term (current) use of aspirin: Secondary | ICD-10-CM

## 2018-04-05 DIAGNOSIS — I130 Hypertensive heart and chronic kidney disease with heart failure and stage 1 through stage 4 chronic kidney disease, or unspecified chronic kidney disease: Secondary | ICD-10-CM | POA: Diagnosis present

## 2018-04-05 DIAGNOSIS — J690 Pneumonitis due to inhalation of food and vomit: Secondary | ICD-10-CM | POA: Diagnosis present

## 2018-04-05 DIAGNOSIS — Z66 Do not resuscitate: Secondary | ICD-10-CM | POA: Diagnosis not present

## 2018-04-05 DIAGNOSIS — J9801 Acute bronchospasm: Secondary | ICD-10-CM | POA: Diagnosis present

## 2018-04-05 DIAGNOSIS — N179 Acute kidney failure, unspecified: Secondary | ICD-10-CM | POA: Diagnosis not present

## 2018-04-05 DIAGNOSIS — Z992 Dependence on renal dialysis: Secondary | ICD-10-CM | POA: Diagnosis not present

## 2018-04-05 DIAGNOSIS — F0390 Unspecified dementia without behavioral disturbance: Secondary | ICD-10-CM | POA: Diagnosis present

## 2018-04-05 DIAGNOSIS — I4891 Unspecified atrial fibrillation: Secondary | ICD-10-CM | POA: Diagnosis not present

## 2018-04-05 DIAGNOSIS — E875 Hyperkalemia: Secondary | ICD-10-CM | POA: Diagnosis not present

## 2018-04-05 DIAGNOSIS — I10 Essential (primary) hypertension: Secondary | ICD-10-CM | POA: Diagnosis not present

## 2018-04-05 DIAGNOSIS — Z9842 Cataract extraction status, left eye: Secondary | ICD-10-CM

## 2018-04-05 DIAGNOSIS — G9340 Encephalopathy, unspecified: Secondary | ICD-10-CM | POA: Diagnosis present

## 2018-04-05 DIAGNOSIS — R062 Wheezing: Secondary | ICD-10-CM

## 2018-04-05 DIAGNOSIS — R0603 Acute respiratory distress: Secondary | ICD-10-CM | POA: Diagnosis not present

## 2018-04-05 DIAGNOSIS — N170 Acute kidney failure with tubular necrosis: Secondary | ICD-10-CM | POA: Diagnosis present

## 2018-04-05 DIAGNOSIS — Z515 Encounter for palliative care: Secondary | ICD-10-CM

## 2018-04-05 DIAGNOSIS — I255 Ischemic cardiomyopathy: Secondary | ICD-10-CM | POA: Diagnosis present

## 2018-04-05 DIAGNOSIS — H3530 Unspecified macular degeneration: Secondary | ICD-10-CM | POA: Diagnosis present

## 2018-04-05 DIAGNOSIS — R6521 Severe sepsis with septic shock: Secondary | ICD-10-CM | POA: Diagnosis present

## 2018-04-05 DIAGNOSIS — B9689 Other specified bacterial agents as the cause of diseases classified elsewhere: Secondary | ICD-10-CM | POA: Diagnosis present

## 2018-04-05 DIAGNOSIS — N10 Acute tubulo-interstitial nephritis: Secondary | ICD-10-CM | POA: Diagnosis not present

## 2018-04-05 DIAGNOSIS — Z888 Allergy status to other drugs, medicaments and biological substances status: Secondary | ICD-10-CM

## 2018-04-05 DIAGNOSIS — M1990 Unspecified osteoarthritis, unspecified site: Secondary | ICD-10-CM | POA: Diagnosis present

## 2018-04-05 DIAGNOSIS — R296 Repeated falls: Secondary | ICD-10-CM | POA: Diagnosis present

## 2018-04-05 DIAGNOSIS — J9602 Acute respiratory failure with hypercapnia: Secondary | ICD-10-CM | POA: Diagnosis not present

## 2018-04-05 DIAGNOSIS — N3090 Cystitis, unspecified without hematuria: Secondary | ICD-10-CM

## 2018-04-05 DIAGNOSIS — J9621 Acute and chronic respiratory failure with hypoxia: Secondary | ICD-10-CM | POA: Diagnosis not present

## 2018-04-05 DIAGNOSIS — E1165 Type 2 diabetes mellitus with hyperglycemia: Secondary | ICD-10-CM | POA: Diagnosis present

## 2018-04-05 DIAGNOSIS — I4819 Other persistent atrial fibrillation: Secondary | ICD-10-CM | POA: Diagnosis present

## 2018-04-05 DIAGNOSIS — Z79899 Other long term (current) drug therapy: Secondary | ICD-10-CM

## 2018-04-05 DIAGNOSIS — N189 Chronic kidney disease, unspecified: Secondary | ICD-10-CM | POA: Diagnosis not present

## 2018-04-05 DIAGNOSIS — Z7189 Other specified counseling: Secondary | ICD-10-CM

## 2018-04-05 DIAGNOSIS — Z8249 Family history of ischemic heart disease and other diseases of the circulatory system: Secondary | ICD-10-CM

## 2018-04-05 DIAGNOSIS — Z87440 Personal history of urinary (tract) infections: Secondary | ICD-10-CM

## 2018-04-05 DIAGNOSIS — N289 Disorder of kidney and ureter, unspecified: Secondary | ICD-10-CM

## 2018-04-05 DIAGNOSIS — Z951 Presence of aortocoronary bypass graft: Secondary | ICD-10-CM

## 2018-04-05 DIAGNOSIS — A419 Sepsis, unspecified organism: Secondary | ICD-10-CM | POA: Diagnosis present

## 2018-04-05 DIAGNOSIS — E1140 Type 2 diabetes mellitus with diabetic neuropathy, unspecified: Secondary | ICD-10-CM | POA: Diagnosis present

## 2018-04-05 DIAGNOSIS — I2510 Atherosclerotic heart disease of native coronary artery without angina pectoris: Secondary | ICD-10-CM | POA: Diagnosis present

## 2018-04-05 DIAGNOSIS — J9600 Acute respiratory failure, unspecified whether with hypoxia or hypercapnia: Secondary | ICD-10-CM

## 2018-04-05 DIAGNOSIS — J441 Chronic obstructive pulmonary disease with (acute) exacerbation: Secondary | ICD-10-CM | POA: Diagnosis present

## 2018-04-05 DIAGNOSIS — E869 Volume depletion, unspecified: Secondary | ICD-10-CM | POA: Diagnosis present

## 2018-04-05 DIAGNOSIS — E785 Hyperlipidemia, unspecified: Secondary | ICD-10-CM | POA: Diagnosis present

## 2018-04-05 DIAGNOSIS — Z974 Presence of external hearing-aid: Secondary | ICD-10-CM

## 2018-04-05 DIAGNOSIS — Z9049 Acquired absence of other specified parts of digestive tract: Secondary | ICD-10-CM

## 2018-04-05 DIAGNOSIS — T380X5A Adverse effect of glucocorticoids and synthetic analogues, initial encounter: Secondary | ICD-10-CM | POA: Diagnosis not present

## 2018-04-05 DIAGNOSIS — D649 Anemia, unspecified: Secondary | ICD-10-CM | POA: Diagnosis not present

## 2018-04-05 LAB — APTT: aPTT: 32 seconds (ref 24–36)

## 2018-04-05 LAB — PROTIME-INR
INR: 1.15
Prothrombin Time: 14.6 seconds (ref 11.4–15.2)

## 2018-04-05 LAB — CBC WITH DIFFERENTIAL/PLATELET
Basophils Absolute: 0.1 10*3/uL (ref 0–0.1)
Basophils Relative: 0 %
EOS PCT: 0 %
Eosinophils Absolute: 0 10*3/uL (ref 0–0.7)
HCT: 38.3 % — ABNORMAL LOW (ref 40.0–52.0)
HEMOGLOBIN: 12.4 g/dL — AB (ref 13.0–18.0)
LYMPHS PCT: 4 %
Lymphs Abs: 1 10*3/uL (ref 1.0–3.6)
MCH: 31.5 pg (ref 26.0–34.0)
MCHC: 32.3 g/dL (ref 32.0–36.0)
MCV: 97.4 fL (ref 80.0–100.0)
Monocytes Absolute: 1.3 10*3/uL — ABNORMAL HIGH (ref 0.2–1.0)
Monocytes Relative: 5 %
NEUTROS ABS: 22.2 10*3/uL — AB (ref 1.4–6.5)
NEUTROS PCT: 91 %
PLATELETS: 211 10*3/uL (ref 150–440)
RBC: 3.93 MIL/uL — AB (ref 4.40–5.90)
RDW: 13.6 % (ref 11.5–14.5)
WBC: 24.7 10*3/uL — AB (ref 3.8–10.6)

## 2018-04-05 LAB — COMPREHENSIVE METABOLIC PANEL
ALT: 20 U/L (ref 0–44)
ANION GAP: 10 (ref 5–15)
AST: 26 U/L (ref 15–41)
Albumin: 3.4 g/dL — ABNORMAL LOW (ref 3.5–5.0)
Alkaline Phosphatase: 88 U/L (ref 38–126)
BUN: 53 mg/dL — ABNORMAL HIGH (ref 8–23)
CHLORIDE: 103 mmol/L (ref 98–111)
CO2: 24 mmol/L (ref 22–32)
Calcium: 8.1 mg/dL — ABNORMAL LOW (ref 8.9–10.3)
Creatinine, Ser: 2.47 mg/dL — ABNORMAL HIGH (ref 0.61–1.24)
GFR, EST AFRICAN AMERICAN: 25 mL/min — AB (ref >60)
GFR, EST NON AFRICAN AMERICAN: 22 mL/min — AB (ref >60)
Glucose, Bld: 173 mg/dL — ABNORMAL HIGH (ref 70–99)
POTASSIUM: 5.4 mmol/L — AB (ref 3.5–5.1)
SODIUM: 137 mmol/L (ref 135–145)
Total Bilirubin: 0.8 mg/dL (ref 0.3–1.2)
Total Protein: 6.5 g/dL (ref 6.5–8.1)

## 2018-04-05 LAB — URINALYSIS, COMPLETE (UACMP) WITH MICROSCOPIC
BILIRUBIN URINE: NEGATIVE
Glucose, UA: NEGATIVE mg/dL
KETONES UR: NEGATIVE mg/dL
Nitrite: NEGATIVE
Protein, ur: 100 mg/dL — AB
Specific Gravity, Urine: 1.01 (ref 1.005–1.030)
pH: 5 (ref 5.0–8.0)

## 2018-04-05 LAB — LACTIC ACID, PLASMA
LACTIC ACID, VENOUS: 2 mmol/L — AB (ref 0.5–1.9)
LACTIC ACID, VENOUS: 1.4 mmol/L (ref 0.5–1.9)

## 2018-04-05 LAB — LIPASE, BLOOD: LIPASE: 21 U/L (ref 11–51)

## 2018-04-05 LAB — PROCALCITONIN: Procalcitonin: 14.06 ng/mL

## 2018-04-05 LAB — TROPONIN I

## 2018-04-05 MED ORDER — HEPARIN SODIUM (PORCINE) 5000 UNIT/ML IJ SOLN
5000.0000 [IU] | Freq: Three times a day (TID) | INTRAMUSCULAR | Status: DC
  Administered 2018-04-05 – 2018-04-10 (×14): 5000 [IU] via SUBCUTANEOUS
  Filled 2018-04-05 (×14): qty 1

## 2018-04-05 MED ORDER — SODIUM CHLORIDE 0.9 % IV BOLUS (SEPSIS)
2000.0000 mL | Freq: Once | INTRAVENOUS | Status: AC
  Administered 2018-04-05: 2000 mL via INTRAVENOUS

## 2018-04-05 MED ORDER — VANCOMYCIN VARIABLE DOSE PER UNSTABLE RENAL FUNCTION (PHARMACIST DOSING): Status: DC

## 2018-04-05 MED ORDER — DOCUSATE SODIUM 100 MG PO CAPS
100.0000 mg | ORAL_CAPSULE | Freq: Two times a day (BID) | ORAL | Status: DC
  Administered 2018-04-05 – 2018-04-14 (×12): 100 mg via ORAL
  Filled 2018-04-05 (×15): qty 1

## 2018-04-05 MED ORDER — ACETAMINOPHEN 325 MG PO TABS: 650.0000 mg | ORAL_TABLET | Freq: Four times a day (QID) | ORAL | Status: DC | PRN

## 2018-04-05 MED ORDER — SODIUM CHLORIDE 0.9 % IV SOLN
INTRAVENOUS | Status: DC
  Administered 2018-04-05 – 2018-04-07 (×3): via INTRAVENOUS

## 2018-04-05 MED ORDER — PRAVASTATIN SODIUM 20 MG PO TABS
40.0000 mg | ORAL_TABLET | Freq: Every day | ORAL | Status: DC
  Administered 2018-04-06 – 2018-04-14 (×9): 40 mg via ORAL
  Filled 2018-04-05: qty 2
  Filled 2018-04-05 (×3): qty 1
  Filled 2018-04-05 (×2): qty 2
  Filled 2018-04-05: qty 1
  Filled 2018-04-05 (×3): qty 2

## 2018-04-05 MED ORDER — GABAPENTIN 300 MG PO CAPS
300.0000 mg | ORAL_CAPSULE | Freq: Two times a day (BID) | ORAL | Status: DC
  Administered 2018-04-05 – 2018-04-11 (×12): 300 mg via ORAL
  Filled 2018-04-05 (×12): qty 1

## 2018-04-05 MED ORDER — VITAMIN D 1000 UNITS PO TABS
1000.0000 [IU] | ORAL_TABLET | Freq: Every day | ORAL | Status: DC
  Administered 2018-04-06 – 2018-04-14 (×9): 1000 [IU] via ORAL
  Filled 2018-04-05 (×10): qty 1

## 2018-04-05 MED ORDER — VANCOMYCIN HCL IN DEXTROSE 1-5 GM/200ML-% IV SOLN
1000.0000 mg | Freq: Once | INTRAVENOUS | Status: AC
  Administered 2018-04-06: 03:00:00 1000 mg via INTRAVENOUS
  Filled 2018-04-05: qty 200

## 2018-04-05 MED ORDER — SENNOSIDES-DOCUSATE SODIUM 8.6-50 MG PO TABS: 1.0000 | ORAL_TABLET | Freq: Every evening | ORAL | Status: DC | PRN

## 2018-04-05 MED ORDER — ACETAMINOPHEN 650 MG RE SUPP: 650.0000 mg | Freq: Four times a day (QID) | RECTAL | Status: DC | PRN

## 2018-04-05 MED ORDER — ONDANSETRON HCL 4 MG PO TABS: 4.0000 mg | ORAL_TABLET | Freq: Four times a day (QID) | ORAL | Status: DC | PRN

## 2018-04-05 MED ORDER — SODIUM CHLORIDE 0.9 % IV SOLN: 1.0000 g | INTRAVENOUS | Status: DC

## 2018-04-05 MED ORDER — SODIUM CHLORIDE 0.9 % IV SOLN
2.0000 g | INTRAVENOUS | Status: DC
  Filled 2018-04-05: qty 2

## 2018-04-05 MED ORDER — ASPIRIN EC 81 MG PO TBEC
81.0000 mg | DELAYED_RELEASE_TABLET | Freq: Every day | ORAL | Status: DC
  Administered 2018-04-06 – 2018-04-15 (×10): 81 mg via ORAL
  Filled 2018-04-05 (×11): qty 1

## 2018-04-05 MED ORDER — SODIUM CHLORIDE 0.9 % IV SOLN
2.0000 g | Freq: Once | INTRAVENOUS | Status: AC
  Administered 2018-04-05: 2 g via INTRAVENOUS
  Filled 2018-04-05: qty 2

## 2018-04-05 MED ORDER — SODIUM POLYSTYRENE SULFONATE 15 GM/60ML PO SUSP
15.0000 g | Freq: Once | ORAL | Status: AC
  Administered 2018-04-05: 15 g via ORAL
  Filled 2018-04-05: qty 60

## 2018-04-05 MED ORDER — METOPROLOL SUCCINATE ER 50 MG PO TB24
100.0000 mg | ORAL_TABLET | Freq: Every day | ORAL | Status: DC
  Administered 2018-04-07 – 2018-04-15 (×9): 100 mg via ORAL
  Filled 2018-04-05: qty 2
  Filled 2018-04-05: qty 1
  Filled 2018-04-05 (×3): qty 2
  Filled 2018-04-05 (×2): qty 1
  Filled 2018-04-05 (×2): qty 2
  Filled 2018-04-05: qty 1

## 2018-04-05 MED ORDER — SODIUM CHLORIDE 0.9 % IV SOLN
500.0000 mg | Freq: Once | INTRAVENOUS | Status: AC
  Administered 2018-04-05: 500 mg via INTRAVENOUS
  Filled 2018-04-05: qty 500

## 2018-04-05 MED ORDER — FLUOXETINE HCL 10 MG PO CAPS
10.0000 mg | ORAL_CAPSULE | Freq: Every day | ORAL | Status: DC
  Administered 2018-04-06 – 2018-04-15 (×10): 10 mg via ORAL
  Filled 2018-04-05 (×11): qty 1

## 2018-04-05 MED ORDER — GLIMEPIRIDE 2 MG PO TABS
2.0000 mg | ORAL_TABLET | Freq: Every day | ORAL | Status: DC
  Administered 2018-04-06 – 2018-04-10 (×5): 2 mg via ORAL
  Filled 2018-04-05 (×6): qty 1

## 2018-04-05 MED ORDER — FINASTERIDE 5 MG PO TABS
5.0000 mg | ORAL_TABLET | Freq: Every day | ORAL | Status: DC
  Administered 2018-04-06 – 2018-04-15 (×10): 5 mg via ORAL
  Filled 2018-04-05 (×11): qty 1

## 2018-04-05 MED ORDER — ONDANSETRON HCL 4 MG/2ML IJ SOLN: 4.0000 mg | Freq: Four times a day (QID) | INTRAMUSCULAR | Status: DC | PRN

## 2018-04-05 NOTE — H&P (Signed)
[facility] - [place] at [facility]   PATIENT NAME: Fred Watson    MR#:  [id number]  DATE OF BIRTH:  Sep 16, 1926  DATE OF ADMISSION:  04/02/2018  PRIMARY CARE PHYSICIAN: Mickey Farber, MD   REQUESTING/REFERRING PHYSICIAN:   CHIEF COMPLAINT:   Chief Complaint  Patient presents with  . Code Sepsis    HISTORY OF PRESENT ILLNESS: Fred Watson  is a 84 y.o. male with a known history of cardiomyopathy, congestive heart failure, COPD, coronary artery disease, type 2 diabetes mellitus, hyperlipidemia, hypertension, macular degeneration presented to the emergency room for generalized weakness.  Patient was unable to get up and move around.  Uses a rolling walker at home.  Lives with his son at home.  Patient also had low blood pressure at presentation.  He has history of recurrent urinary tract infections in the past.  He was evaluated in the emergency room found to be having elevated WBC count but urinalysis showed infection.  Code sepsis was called patient was started on broad-spectrum antibiotics.  Hospitalist service was consulted.  Patient is hard of hearing and uses hearing aids.  PAST MEDICAL HISTORY:   Past Medical History:  Diagnosis Date  . Arthritis   . Cardiomyopathy (HCC)   . CHF (congestive heart failure) (HCC)   . COPD (chronic obstructive pulmonary disease) (HCC)   . Coronary artery disease   . Depression   . Diabetes mellitus without complication (HCC)   . Hearing loss   . Hyperlipidemia   . Hypertension   . Macular degeneration     PAST SURGICAL HISTORY:  Past Surgical History:  Procedure Laterality Date  . CATARACT EXTRACTION W/PHACO Left 11/12/2017   Procedure: CATARACT EXTRACTION PHACO AND INTRAOCULAR LENS PLACEMENT (IOC) LEFT DIABETIC;  Surgeon: Nevada Crane, MD;  Location: [facility];  Service: Ophthalmology;  Laterality: Left;  Diabetic  . CHOLECYSTECTOMY    . CORONARY ARTERY BYPASS GRAFT    . HERNIA REPAIR      SOCIAL  HISTORY:  Social History   Tobacco Use  . Smoking status: Never Smoker  . Smokeless tobacco: Never Used  Substance Use Topics  . Alcohol use: Not on file    FAMILY HISTORY:  Family History  Problem Relation Age of Onset  . Stroke Mother   . CAD Father   . Heart attack Father     DRUG ALLERGIES:  Allergies  Allergen Reactions  . Ezetimibe Other (See Comments)  . Niacin Other (See Comments)    Flushing  . Ace Inhibitors Cough    Tolerating lisinopril 20 mg without side effects 02/01/2015  . Atorvastatin Other (See Comments)    Nightmares  . Losartan Other (See Comments)    REVIEW OF SYSTEMS:   CONSTITUTIONAL: No fever, has fatigue and weakness.  EYES: No blurred or double vision.  EARS, NOSE, AND THROAT: No tinnitus or ear pain.  RESPIRATORY: No cough, shortness of breath, wheezing or hemoptysis.  CARDIOVASCULAR: No chest pain, orthopnea, edema.  GASTROINTESTINAL: No nausea, vomiting, diarrhea or abdominal pain.  GENITOURINARY: No dysuria, hematuria.  ENDOCRINE: No polyuria, nocturia,  HEMATOLOGY: No anemia, easy bruising or bleeding SKIN: No rash or lesion. MUSCULOSKELETAL: No joint pain or arthritis.   NEUROLOGIC: No tingling, numbness, weakness.  PSYCHIATRY: No anxiety or depression.   MEDICATIONS AT HOME:  Prior to Admission medications   Medication Sig Start Date End Date Taking? Authorizing Provider  aspirin EC 81 MG tablet Take 81 mg by mouth daily.   Yes  [provider]  cholecalciferol (VITAMIN D) 1000 units tablet Take 1,000 Units by mouth daily.   Yes [provider]  docusate sodium (COLACE) 100 MG capsule Take 100 mg by mouth 2 (two) times daily.   Yes [provider]  finasteride (PROSCAR) 5 MG tablet Take 5 mg by mouth daily.   Yes [provider]  FLUoxetine (PROZAC) 10 MG capsule Take 1 capsule by mouth daily. 04-28-2018  Yes [provider]  furosemide (LASIX) 40 MG tablet Take 1 tablet by mouth daily.  04-28-18  Yes [provider]  gabapentin (NEURONTIN) 300 MG capsule Take 300 mg by mouth 2 (two) times daily.   Yes [provider]  glimepiride (AMARYL) 2 MG tablet Take 1 tablet (2 mg total) by mouth daily with breakfast. 02/24/18 04/25/18 Yes Sainani, Rolly Pancake, MD  lisinopril (PRINIVIL,ZESTRIL) 20 MG tablet Take 20 mg by mouth daily.   Yes [provider]  metoprolol succinate (TOPROL-XL) 100 MG 24 hr tablet Take 100 mg by mouth daily. Take with or immediately following a meal.   Yes [provider]  pravastatin (PRAVACHOL) 40 MG tablet Take 40 mg by mouth daily.   Yes [provider]      PHYSICAL EXAMINATION:   VITAL SIGNS: Blood pressure (!) 123/58, pulse 77, temperature 98.7 F (37.1 C), temperature source Oral, resp. rate (!) 23, height 6' (1.829 m), weight 79.4 kg, SpO2 98 %.  GENERAL:  84 y.o.-year-old patient lying in the bed with no acute distress.  EYES: Pupils equal, round, reactive to light and accommodation. No scleral icterus. Extraocular muscles intact.  HEENT: Head atraumatic, normocephalic. Oropharynx dry and nasopharynx clear.  NECK:  Supple, no jugular venous distention. No thyroid enlargement, no tenderness.  LUNGS: Normal breath sounds bilaterally, no wheezing, rales,rhonchi or crepitation. No use of accessory muscles of respiration.  CARDIOVASCULAR: S1, S2 normal. No murmurs, rubs, or gallops.  ABDOMEN: Soft, nontender, nondistended. Bowel sounds present. No organomegaly or mass.  EXTREMITIES: No pedal edema, cyanosis, or clubbing.  NEUROLOGIC: Cranial nerves II through XII are intact. Muscle strength 5/5 in all extremities. Sensation intact. Gait not checked.  PSYCHIATRIC: The patient is alert and oriented x 3.  SKIN: No obvious rash, lesion, or ulcer.   LABORATORY PANEL:   CBC Recent Labs  Lab 28-Apr-2018 1716  WBC 24.7*  HGB 12.4*  HCT 38.3*  PLT 211  MCV 97.4  MCH 31.5  MCHC 32.3  RDW 13.6  LYMPHSABS 1.0   MONOABS 1.3*  EOSABS 0.0  BASOSABS 0.1   ------------------------------------------------------------------------------------------------------------------  Chemistries  Recent Labs  Lab 04/28/2018 1716  NA 137  K 5.4*  CL 103  CO2 24  GLUCOSE 173*  BUN 53*  CREATININE 2.47*  CALCIUM 8.1*  AST 26  ALT 20  ALKPHOS 88  BILITOT 0.8   ------------------------------------------------------------------------------------------------------------------ estimated creatinine clearance is 22.3 mL/min (A) (by C-G formula based on SCr of 2.47 mg/dL (H)). ------------------------------------------------------------------------------------------------------------------ No results for input(s): TSH, T4TOTAL, T3FREE, THYROIDAB in the last 72 hours.  Invalid input(s): FREET3   Coagulation profile Recent Labs  Lab 2018-04-28 1716  INR 1.15   ------------------------------------------------------------------------------------------------------------------- No results for input(s): DDIMER in the last 72 hours. -------------------------------------------------------------------------------------------------------------------  Cardiac Enzymes Recent Labs  Lab 2018-04-28 1716  TROPONINI <0.03   ------------------------------------------------------------------------------------------------------------------ Invalid input(s): POCBNP  ---------------------------------------------------------------------------------------------------------------  Urinalysis    Component Value Date/Time   COLORURINE YELLOW (A) 2018-04-28 1716   APPEARANCEUR CLOUDY (A) 04-28-18 1716   LABSPEC 1.010 2018/04/28 1716   PHURINE  5.0 04/23/2018 1716   GLUCOSEU NEGATIVE 04/11/2018 1716   HGBUR SMALL (A) 04/22/2018 1716   BILIRUBINUR NEGATIVE 04/18/2018 1716   KETONESUR NEGATIVE 04/04/2018 1716   PROTEINUR 100 (A) 04/12/2018 1716   NITRITE NEGATIVE 04/14/2018 1716   LEUKOCYTESUR LARGE (A) 04/07/2018 1716      RADIOLOGY: Dg Chest Port 1 View  Result Date: 04/02/2018 CLINICAL DATA:  Pt arrived via ems from home with hypotension and AMS. Pt has chronic UTI's, DM, and afib. Pt intia pressure b y home health RN 50/30, pressure increased with fluids to 92/64. Pt lethargic but in no distress. Hx - cardiomyopathy, CHF, COPD, diabetes, HTN, CABG, non-smoker EXAM: PORTABLE CHEST 1 VIEW COMPARISON:  05/03/2009.  05/02/2009. FINDINGS: Stable changes from prior CABG surgery. Cardiac silhouette is mildly enlarged. No mediastinal or hilar masses. No evidence of adenopathy. There is hazy airspace opacity in the left mid to lower lung. Remainder of the lungs is clear. Pleuroparenchymal scarring noted at the apices is stable. No convincing pleural effusion.  No pneumothorax. Skeletal structures are grossly intact. IMPRESSION: 1. Mild hazy ground-glass type opacity noted in the left mid to lower lung. Patient had similar opacities in this location on the prior exams. This may be chronic, or could reflect acute pneumonia. 2. No other evidence of an acute abnormality.  No pulmonary edema. 3. Status post CABG surgery.  Mild cardiomegaly. Electronically Signed   By: Amie Portland M.D.   On: 04/07/2018 17:38    EKG: Orders placed or performed during the hospital encounter of 04/14/2018  . EKG 12-Lead  . EKG 12-Lead  . EKG 12-Lead  . EKG 12-Lead    IMPRESSION AND PLAN:  84 year old elderly male patient with history of cardiomyopathy, congestive heart failure, COPD, coronary artery disease, diabetes mellitus type 2, hyperlipidemia, hypertension, macular degeneration presented to the emergency room for weakness  -Sepsis secondary to urinary tract infection Admit patient to medical floor Broad-spectrum IV antibiotics and cultures  -Acute urinary tract infection Patient has history of recurrent UTI IV cefepime antibiotic  -Left lung pneumonia Patient started on broad-spectrum antibiotics for sepsis  -Acute kidney  injury IV fluids and follow-up renal function  -Acute hyperkalemia Hold ACE inhibitor Follow-up renal function and potassium level Oral Kayexalate  -Ambulatory dysfunction Physical therapy evaluation  All the records are reviewed and case discussed with ED provider. Management plans discussed with the patient, family and they are in agreement.  CODE STATUS:Full code Code Status History    Date Active Date Inactive Code Status Order ID Comments User Context   02/22/2018 1443 02/24/2018 2003 Full Code [id number]  Shaune Pollack, MD ED       TOTAL TIME TAKING CARE OF THIS PATIENT: 52 minutes.    Ihor Austin M.D on 04/02/2018 at 8:41 PM  Between 7am to 6pm - Pager - [phone]  After 6pm go to [email or web address] - password [email or web address] [facility]  [facility] [place] [facility]  Office  [phone]  CC: Primary care physician; Mickey Farber, MD

## 2018-04-05 NOTE — Progress Notes (Signed)
CODE SEPSIS - PHARMACY COMMUNICATION  **Broad Spectrum Antibiotics should be administered within 1 hour of Sepsis diagnosis**  Time Code Sepsis Called/Page Received: 1708  Antibiotics Ordered: Cefepime @ 1707  Time of 1st antibiotic administration: 1731  Additional action taken by pharmacy: n/a  If necessary, Name of Provider/Nurse Contacted: n/a    Orinda Kenner ,PharmD Clinical Pharmacist  04/29/2018  5:13 PM

## 2018-04-05 NOTE — ED Notes (Signed)
[facility], FLOOR CALLED

## 2018-04-05 NOTE — ED Triage Notes (Signed)
Pt arrived via ems from home with hypotension and AMS. Pt has chronic UTI's, DM, and afib.Pt intia pressure b y home health RN 50/30, pressure increased with fluids to 92/64. Pt lethargic but in no distress.

## 2018-04-05 NOTE — Consult Note (Addendum)
Pharmacy Antibiotic Note  Fred Watson is a 84 y.o. male admitted on 04/04/2018 with sepsis.  Pharmacy has been consulted for cefepime dosing.  Plan: Vancomycin 1000 mg IV once followed by 6 hour stacked dosing vancomycin 1000 mg IV every 36 hours.  Goal trough 15-20 mcg/mL.  Will check BMP in the morning to verify renal function and draw random level prior to the third dose. Cefepime 2 gm IV every 24 hours  Height: 6' (182.9 cm) Weight: 175 lb (79.4 kg) IBW/kg (Calculated) : 77.6  Temp (24hrs), Avg:98.7 F (37.1 C), Min:98.7 F (37.1 C), Max:98.7 F (37.1 C)  Recent Labs  Lab 04/06/2018 1716  WBC 24.7*  CREATININE 2.47*    Estimated Creatinine Clearance: 22.3 mL/min (A) (by C-G formula based on SCr of 2.47 mg/dL (H)).    Allergies  Allergen Reactions  . Ezetimibe Other (See Comments)  . Niacin Other (See Comments)    Flushing  . Ace Inhibitors Cough    Tolerating lisinopril 20 mg without side effects 02/01/2015  . Atorvastatin Other (See Comments)    Nightmares  . Losartan Other (See Comments)    Antimicrobials this admission: cefepeime 10/05 >>   Dose adjustments this admission:   Microbiology results: 10/05 BCx:  10/05 UCx:    Sputum:    MRSA PCR:   Thank you for allowing pharmacy to be a part of this patient's care.  Orinda Kenner, PharmD 04/20/2018 6:18 PM

## 2018-04-05 NOTE — Progress Notes (Signed)
Advanced care plan.  Purpose of the Encounter: CODE STATUS  Parties in Attendance: Patient and family  Patient's Decision Capacity: Good  Subjective/Patient's story: Presented to the emergency room for low blood pressure and weakness   Objective/Medical story Has urinary tract infection and sepsis Needs IV antibiotics and IV fluids   Goals of care determination:  Advance care directives and goals of care discussed with the patient and family Patient wants everything done which includes CPR, intubation and ventilator if the need arises   CODE STATUS: Full code   Time spent discussing advanced care planning: 16 minutes

## 2018-04-05 NOTE — ED Provider Notes (Signed)
[facility] Emergency Department Provider Note  ____________________________________________  Time seen: Approximately 8:03 PM  I have reviewed the triage vital signs and the nursing notes.   HISTORY  Chief Complaint Code Sepsis  Level 5 Caveat: Portions of the History and Physical including HPI and review of systems are unable to be completely obtained due to patient being a poor historian    HPI Fred Watson is a 84 y.o. male with a history of CHF COPD diabetes hypertension is brought to the ED due to decreased energy, low blood pressure at home.  EMS report very foul urine at home as well.  Has a history of recurrent UTIs.      Past Medical History:  Diagnosis Date  . Arthritis   . Cardiomyopathy (HCC)   . CHF (congestive heart failure) (HCC)   . COPD (chronic obstructive pulmonary disease) (HCC)   . Coronary artery disease   . Depression   . Diabetes mellitus without complication (HCC)   . Hearing loss   . Hyperlipidemia   . Hypertension   . Macular degeneration      Patient Active Problem List   Diagnosis Date Noted  . Sepsis (HCC) 2018/05/03  . Hypoglycemia 02/22/2018     Past Surgical History:  Procedure Laterality Date  . CATARACT EXTRACTION W/PHACO Left 11/12/2017   Procedure: CATARACT EXTRACTION PHACO AND INTRAOCULAR LENS PLACEMENT (IOC) LEFT DIABETIC;  Surgeon: Nevada Crane, MD;  Location: [facility];  Service: Ophthalmology;  Laterality: Left;  Diabetic  . CHOLECYSTECTOMY    . CORONARY ARTERY BYPASS GRAFT    . HERNIA REPAIR       Prior to Admission medications   Medication Sig Start Date End Date Taking? Authorizing Provider  aspirin EC 81 MG tablet Take 81 mg by mouth daily.   Yes [provider]  cholecalciferol (VITAMIN D) 1000 units tablet Take 1,000 Units by mouth daily.   Yes [provider]  docusate sodium (COLACE) 100 MG capsule Take 100 mg by mouth 2 (two) times daily.   Yes  [provider]  finasteride (PROSCAR) 5 MG tablet Take 5 mg by mouth daily.   Yes [provider]  FLUoxetine (PROZAC) 10 MG capsule Take 1 capsule by mouth daily. 2018-05-03  Yes [provider]  furosemide (LASIX) 40 MG tablet Take 1 tablet by mouth daily. 2018/05/03  Yes [provider]  gabapentin (NEURONTIN) 300 MG capsule Take 300 mg by mouth 2 (two) times daily.   Yes [provider]  glimepiride (AMARYL) 2 MG tablet Take 1 tablet (2 mg total) by mouth daily with breakfast. 02/24/18 04/25/18 Yes Sainani, Rolly Pancake, MD  lisinopril (PRINIVIL,ZESTRIL) 20 MG tablet Take 20 mg by mouth daily.   Yes [provider]  metoprolol succinate (TOPROL-XL) 100 MG 24 hr tablet Take 100 mg by mouth daily. Take with or immediately following a meal.   Yes [provider]  pravastatin (PRAVACHOL) 40 MG tablet Take 40 mg by mouth daily.   Yes [provider]     Allergies Ezetimibe; Niacin; Ace inhibitors; Atorvastatin; and Losartan   Family History  Problem Relation Age of Onset  . Stroke Mother   . CAD Father   . Heart attack Father     Social History Social History   Tobacco Use  . Smoking status: Never Smoker  . Smokeless tobacco: Never Used  Substance Use Topics  . Alcohol use: Not on file  . Drug use: Not on  file    Review of Systems  Constitutional:   No fever positive chills.  ENT:   No sore throat. No rhinorrhea. Cardiovascular:   No chest pain or syncope. Respiratory:   No dyspnea or cough. Gastrointestinal:   Negative for abdominal pain, vomiting and diarrhea.  Musculoskeletal:   Negative for focal pain or swelling All other systems reviewed and are negative except as documented above in ROS and HPI.  ____________________________________________   PHYSICAL EXAM:  VITAL SIGNS: ED Triage Vitals  Enc Vitals Group     BP 19-Apr-2018 1702 118/72     Pulse Rate 2018-04-19 1704 (!) 103     Resp 04/19/2018 1704 20      Temp 04/19/18 1704 98.7 F (37.1 C)     Temp Source 04-19-18 1704 Oral     SpO2 19-Apr-2018 1704 92 %     Weight 04-19-2018 1705 175 lb (79.4 kg)     Height 2018/04/19 1705 6' (1.829 m)     Head Circumference --      Peak Flow --      Pain Score April 19, 2018 1704 0     Pain Loc --      Pain Edu? --      Excl. in GC? --     Vital signs reviewed, nursing assessments reviewed.   Constitutional:   Alert and oriented.  Ill-appearing Eyes:   Conjunctivae are normal. EOMI. PERRL. ENT      Head:   Normocephalic and atraumatic.      Nose:   No congestion/rhinnorhea.       Mouth/Throat:   Dry mucous membranes, no pharyngeal erythema. No peritonsillar mass.       Neck:   No meningismus. Full ROM. Hematological/Lymphatic/Immunilogical:   No cervical lymphadenopathy. Cardiovascular:   RRR. Symmetric bilateral radial and DP pulses.  No murmurs. Cap refill less than 2 seconds. Respiratory:   Normal respiratory effort without tachypnea/retractions. Breath sounds are clear and equal bilaterally. No wheezes/rales/rhonchi. Gastrointestinal:   Soft and nontender. Non distended. There is no CVA tenderness.  No rebound, rigidity, or guarding. Musculoskeletal:   Normal range of motion in all extremities. No joint effusions.  No lower extremity tenderness.  No edema. Neurologic:   Normal speech, limited language  Motor grossly intact. No acute focal neurologic deficits are appreciated.  Skin:    Skin is warm, dry and intact. No rash noted.  No petechiae, purpura, or bullae.  ____________________________________________    LABS (pertinent positives/negatives) (all labs ordered are listed, but only abnormal results are displayed) Labs Reviewed  LACTIC ACID, PLASMA - Abnormal; Notable for the following components:      Result Value   Lactic Acid, Venous 2.0 (*)    All other components within normal limits  COMPREHENSIVE METABOLIC PANEL - Abnormal; Notable for the following components:   Potassium 5.4 (*)     Glucose, Bld 173 (*)    BUN 53 (*)    Creatinine, Ser 2.47 (*)    Calcium 8.1 (*)    Albumin 3.4 (*)    GFR calc non Af Amer 22 (*)    GFR calc Af Amer 25 (*)    All other components within normal limits  CBC WITH DIFFERENTIAL/PLATELET - Abnormal; Notable for the following components:   WBC 24.7 (*)    RBC 3.93 (*)    Hemoglobin 12.4 (*)    HCT 38.3 (*)    Neutro Abs 22.2 (*)    Monocytes Absolute 1.3 (*)  All other components within normal limits  URINALYSIS, COMPLETE (UACMP) WITH MICROSCOPIC - Abnormal; Notable for the following components:   Color, Urine YELLOW (*)    APPearance CLOUDY (*)    Hgb urine dipstick SMALL (*)    Protein, ur 100 (*)    Leukocytes, UA LARGE (*)    WBC, UA >50 (*)    Bacteria, UA MANY (*)    All other components within normal limits  CULTURE, BLOOD (ROUTINE X 2)  CULTURE, BLOOD (ROUTINE X 2)  URINE CULTURE  LIPASE, BLOOD  TROPONIN I  PROCALCITONIN  APTT  PROTIME-INR  LACTIC ACID, PLASMA  LACTIC ACID, PLASMA   ____________________________________________   EKG  Interpreted by me Atrial fibrillation rate of 95, normal axis and intervals.  Normal QRS ST segments and T waves.  ____________________________________________    RADIOLOGY  Dg Chest Port 1 View  Result Date: 05/05/18 CLINICAL DATA:  Pt arrived via ems from home with hypotension and AMS. Pt has chronic UTI's, DM, and afib. Pt intia pressure b y home health RN 50/30, pressure increased with fluids to 92/64. Pt lethargic but in no distress. Hx - cardiomyopathy, CHF, COPD, diabetes, HTN, CABG, non-smoker EXAM: PORTABLE CHEST 1 VIEW COMPARISON:  05/03/2009.  05/02/2009. FINDINGS: Stable changes from prior CABG surgery. Cardiac silhouette is mildly enlarged. No mediastinal or hilar masses. No evidence of adenopathy. There is hazy airspace opacity in the left mid to lower lung. Remainder of the lungs is clear. Pleuroparenchymal scarring noted at the apices is stable. No convincing  pleural effusion.  No pneumothorax. Skeletal structures are grossly intact. IMPRESSION: 1. Mild hazy ground-glass type opacity noted in the left mid to lower lung. Patient had similar opacities in this location on the prior exams. This may be chronic, or could reflect acute pneumonia. 2. No other evidence of an acute abnormality.  No pulmonary edema. 3. Status post CABG surgery.  Mild cardiomegaly. Electronically Signed   By: Amie Portland M.D.   On: 05-05-18 17:38    ____________________________________________   PROCEDURES .Critical Care Performed by: Sharman Cheek, MD Authorized by: Sharman Cheek, MD   Critical care provider statement:    Critical care time (minutes):  35   Critical care time was exclusive of:  Separately billable procedures and treating other patients   Critical care was necessary to treat or prevent imminent or life-threatening deterioration of the following conditions:  Shock and sepsis   Critical care was time spent personally by me on the following activities:  Development of treatment plan with patient or surrogate, discussions with consultants, evaluation of patient's response to treatment, examination of patient, obtaining history from patient or surrogate, ordering and performing treatments and interventions, ordering and review of laboratory studies, ordering and review of radiographic studies, pulse oximetry, re-evaluation of patient's condition and review of old charts    ____________________________________________  DIFFERENTIAL DIAGNOSIS   UTI, pneumonia, dehydration, sepsis  CLINICAL IMPRESSION / ASSESSMENT AND PLAN / ED COURSE  Pertinent labs & imaging results that were available during my care of the patient were reviewed by me and considered in my medical decision making (see chart for details).    Patient presents with hypotension, likely recurrent UTI.  Cefepime, 30 mL/kg bolus of IV fluids.  Sepsis work-up.  Clinical Course as of Apr 05 2025  Sat 2018-05-05  1800 Chest xray likely unchanged from chronic appearance, but raises question of new pneumonia. Will add azithromycin to cefepime already being given for UTI.  DG Chest Port 1 View [PS]  1801 C/w sepsis  WBC(!): 24.7 [PS]  2000 Acute on chronic renal insufficiency.  Urinalysis shows a UTI consistent with sepsis from cystitis/pyelonephritis.  Plan to admit for further management.  Creatinine(!): 2.47 [PS]    Clinical Course User Index [PS] Sharman Cheek, MD     ____________________________________________   FINAL CLINICAL IMPRESSION(S) / ED DIAGNOSES    Final diagnoses:  Septic shock (HCC)  Acute on chronic renal insufficiency  Cystitis     ED Discharge Orders    None      Portions of this note were generated with dragon dictation software. Dictation errors may occur despite best attempts at proofreading.    Sharman Cheek, MD 04/04/2018 2026

## 2018-04-06 LAB — GLUCOSE, CAPILLARY
GLUCOSE-CAPILLARY: 126 mg/dL — AB (ref 70–99)
Glucose-Capillary: 146 mg/dL — ABNORMAL HIGH (ref 70–99)
Glucose-Capillary: 197 mg/dL — ABNORMAL HIGH (ref 70–99)
Glucose-Capillary: 130 mg/dL — ABNORMAL HIGH (ref 70–99)

## 2018-04-06 LAB — BLOOD CULTURE ID PANEL (REFLEXED)

## 2018-04-06 LAB — BASIC METABOLIC PANEL
Anion gap: 7 (ref 5–15)
BUN: 54 mg/dL — AB (ref 8–23)
CO2: 26 mmol/L (ref 22–32)
CREATININE: 2.02 mg/dL — AB (ref 0.61–1.24)
Calcium: 7.7 mg/dL — ABNORMAL LOW (ref 8.9–10.3)
Chloride: 107 mmol/L (ref 98–111)
GFR calc Af Amer: 32 mL/min — ABNORMAL LOW (ref >60)
GFR calc non Af Amer: 28 mL/min — ABNORMAL LOW (ref >60)
GLUCOSE: 211 mg/dL — AB (ref 70–99)
Potassium: 4.8 mmol/L (ref 3.5–5.1)
Sodium: 140 mmol/L (ref 135–145)

## 2018-04-06 LAB — CBC
HCT: 34.2 % — ABNORMAL LOW (ref 40.0–52.0)
Hemoglobin: 11.5 g/dL — ABNORMAL LOW (ref 13.0–18.0)
MCH: 32.5 pg (ref 26.0–34.0)
MCHC: 33.8 g/dL (ref 32.0–36.0)
MCV: 96.3 fL (ref 80.0–100.0)
PLATELETS: 196 10*3/uL (ref 150–440)
RBC: 3.55 MIL/uL — AB (ref 4.40–5.90)
RDW: 13.6 % (ref 11.5–14.5)
WBC: 21.1 10*3/uL — ABNORMAL HIGH (ref 3.8–10.6)

## 2018-04-06 LAB — MRSA PCR SCREENING: MRSA by PCR: NEGATIVE

## 2018-04-06 MED ORDER — SODIUM CHLORIDE 0.9 % IV SOLN
1.0000 g | INTRAVENOUS | Status: DC
  Administered 2018-04-06: 1 g via INTRAVENOUS
  Filled 2018-04-06 (×2): qty 1

## 2018-04-06 MED ORDER — INSULIN ASPART 100 UNIT/ML ~~LOC~~ SOLN
0.0000 [IU] | Freq: Three times a day (TID) | SUBCUTANEOUS | Status: DC
  Administered 2018-04-06: 1 [IU] via SUBCUTANEOUS
  Administered 2018-04-07: 13:00:00 3 [IU] via SUBCUTANEOUS
  Administered 2018-04-07 – 2018-04-08 (×3): 2 [IU] via SUBCUTANEOUS
  Administered 2018-04-08: 1 [IU] via SUBCUTANEOUS
  Administered 2018-04-08: 2 [IU] via SUBCUTANEOUS
  Administered 2018-04-09: 3 [IU] via SUBCUTANEOUS
  Filled 2018-04-06 (×8): qty 1

## 2018-04-06 MED ORDER — VANCOMYCIN HCL 10 G IV SOLR
1250.0000 mg | INTRAVENOUS | Status: DC
  Administered 2018-04-06: 1250 mg via INTRAVENOUS
  Filled 2018-04-06 (×2): qty 1250

## 2018-04-06 MED ORDER — VANCOMYCIN HCL 10 G IV SOLR
1250.0000 mg | INTRAVENOUS | Status: DC
  Filled 2018-04-06 (×2): qty 1250

## 2018-04-06 MED ORDER — VANCOMYCIN HCL IN DEXTROSE 1-5 GM/200ML-% IV SOLN
1000.0000 mg | INTRAVENOUS | Status: DC
  Filled 2018-04-06: qty 200

## 2018-04-06 NOTE — Consult Note (Signed)
Pharmacy Antibiotic Note  Fred Watson is a 84 y.o. male admitted on 05/01/2018 with sepsis.  Pharmacy has been consulted for vancomycin and cefepime dosing.  He was evaluated in the emergency room and found to have an elevated WBC count and urinalysis showed infection. He was started on vancomycin 1000 mg IV every 36 hours but Scr has improved: estimated Creatinine Clearance: 27.2 mL/min (A) (by C-G formula based on SCr of 2.47 mg/dL (H)).   Plan: 1) Increase vancomycin dose to 1250 mg IV every 36 hours.  Goal trough 15-20 mcg/mL.  Will draw Vt prior to the 4th dose. T1/2: 29h, K 0.024h-1, Vd 55L, calculated concentration at steady-state: 38.7/16.7 mcg/mL 2) continue Cefepime 2 gm IV every 24 hours  Height: 6' (182.9 cm) Weight: 175 lb (79.4 kg) IBW/kg (Calculated) : 77.6  Temp (24hrs), Avg:98 F (36.7 C), Min:97.6 F (36.4 C), Max:98.7 F (37.1 C)  Recent Labs  Lab 04/17/2018 1716 04/07/2018 2108 04/06/18 0434  WBC 24.7*  --  21.1*  CREATININE 2.47*  --  2.02*  LATICACIDVEN 2.0* 1.4  --     Estimated Creatinine Clearance: 27.2 mL/min (A) (by C-G formula based on SCr of 2.02 mg/dL (H)).    Allergies  Allergen Reactions  . Ezetimibe Other (See Comments)  . Niacin Other (See Comments)    Flushing  . Ace Inhibitors Cough    Tolerating lisinopril 20 mg without side effects 02/01/2015  . Atorvastatin Other (See Comments)    Nightmares  . Losartan Other (See Comments)    Antimicrobials this admission: cefepeime 10/05 >>  Vancomycin 10/05>>  Microbiology results: 10/05 BCx: pending 10/05 UCx:  pending:   Thank you for allowing pharmacy to be a part of this patient's care.  Lowella Bandy, PharmD 04/06/2018 7:46 AM

## 2018-04-06 NOTE — Consult Note (Signed)
Pharmacy Antibiotic Note  Fred Watson is a 84 y.o. male admitted on 2018-05-04 with sepsis.  Pharmacy has been consulted for vancomycin and cefepime dosing.  He was evaluated in the emergency room and found to have an elevated WBC count and urinalysis showed infection. He was started on vancomycin 1000 mg IV every 36 hours but Scr has improved: estimated Creatinine Clearance: 27.2 mL/min (A) (by C-G formula based on SCr of 2.47 mg/dL (H)).   Plan: 1) Increase vancomycin dose to 1250 mg IV every 36 hours.  Goal trough 15-20 mcg/mL.  Will draw Vt prior to the 4th dose. T1/2: 29h, K 0.024h-1, Vd 55L, calculated concentration at steady-state: 38.7/16.7 mcg/mL 2) Cefepime: Will decrease  Cefepime 1 gm IV every 24 hours  Height: 6' (182.9 cm) Weight: 175 lb (79.4 kg) IBW/kg (Calculated) : 77.6  Temp (24hrs), Avg:98 F (36.7 C), Min:97.6 F (36.4 C), Max:98.7 F (37.1 C)  Recent Labs  Lab 05/04/2018 1716 05/04/2018 2108 04/06/18 0434  WBC 24.7*  --  21.1*  CREATININE 2.47*  --  2.02*  LATICACIDVEN 2.0* 1.4  --     Estimated Creatinine Clearance: 27.2 mL/min (A) (by C-G formula based on SCr of 2.02 mg/dL (H)).    Allergies  Allergen Reactions  . Ezetimibe Other (See Comments)  . Niacin Other (See Comments)    Flushing  . Ace Inhibitors Cough    Tolerating lisinopril 20 mg without side effects 02/01/2015  . Atorvastatin Other (See Comments)    Nightmares  . Losartan Other (See Comments)    Antimicrobials this admission: cefepeime 10/05 >>  Vancomycin 10/05>>  Microbiology results: 10/05 BCx: pending 10/05 UCx:  pending:   Thank you for allowing pharmacy to be a part of this patient's care.  Demetrius Charity, PharmD 04/06/2018 2:10 PM

## 2018-04-06 NOTE — Progress Notes (Signed)
[employer] - [place] [facility]      PATIENT NAME: Fred Watson    MR#:  [id number]  DATE OF BIRTH:  1927-01-10  SUBJECTIVE:   Patient is very hard of hearing and presented to the hospital due to hypotension and presumed sepsis from suspected UTI and pneumonia.  Patient is very hard of hearing and difficult to communicate with the patient.  He denies any pain or any discomfort.  Blood pressure has improved since yesterday.  REVIEW OF SYSTEMS:    Review of Systems  Constitutional: Positive for malaise/fatigue. Negative for chills and fever.  HENT: Negative for congestion and tinnitus.   Eyes: Negative for blurred vision and double vision.  Respiratory: Negative for cough, shortness of breath and wheezing.   Cardiovascular: Negative for chest pain, orthopnea and PND.  Gastrointestinal: Negative for abdominal pain, diarrhea, nausea and vomiting.  Genitourinary: Negative for dysuria and hematuria.  Neurological: Positive for weakness (generalized. ). Negative for dizziness, sensory change and focal weakness.  All other systems reviewed and are negative.   Nutrition: Heart Healthy/Carb modified Tolerating Diet: Yes Tolerating PT: Await Eval.    DRUG ALLERGIES:   Allergies  Allergen Reactions  . Ezetimibe Other (See Comments)  . Niacin Other (See Comments)    Flushing  . Ace Inhibitors Cough    Tolerating lisinopril 20 mg without side effects 02/01/2015  . Atorvastatin Other (See Comments)    Nightmares  . Losartan Other (See Comments)    VITALS:  Blood pressure (!) 127/55, pulse 69, temperature 98.1 F (36.7 C), temperature source Oral, resp. rate 16, height 6' (1.829 m), weight 79.4 kg, SpO2 100 %.  PHYSICAL EXAMINATION:   Physical Exam  GENERAL:  84 y.o.-year-old patient lying in bed very hard of hearing but in NAD.  EYES: Pupils equal, round, reactive to light and accommodation. No scleral icterus. Extraocular muscles intact.  HEENT: Head  atraumatic, normocephalic. Oropharynx and nasopharynx clear.  NECK:  Supple, no jugular venous distention. No thyroid enlargement, no tenderness.  LUNGS: Normal breath sounds bilaterally, no wheezing, rales, rhonchi. No use of accessory muscles of respiration.  CARDIOVASCULAR: S1, S2 normal. No murmurs, rubs, or gallops.  ABDOMEN: Soft, nontender, nondistended. Bowel sounds present. No organomegaly or mass.  EXTREMITIES: No cyanosis, clubbing or edema b/l.    NEUROLOGIC: Cranial nerves II through XII are intact. No focal Motor or sensory deficits b/l.Globally weak.    PSYCHIATRIC: The patient is alert and oriented x 1.  SKIN: No obvious rash, lesion, or ulcer.    LABORATORY PANEL:   CBC Recent Labs  Lab 04/06/18 0434  WBC 21.1*  HGB 11.5*  HCT 34.2*  PLT 196   ------------------------------------------------------------------------------------------------------------------  Chemistries  Recent Labs  Lab 04-18-18 1716 04/06/18 0434  NA 137 140  K 5.4* 4.8  CL 103 107  CO2 24 26  GLUCOSE 173* 211*  BUN 53* 54*  CREATININE 2.47* 2.02*  CALCIUM 8.1* 7.7*  AST 26  --   ALT 20  --   ALKPHOS 88  --   BILITOT 0.8  --    ------------------------------------------------------------------------------------------------------------------  Cardiac Enzymes Recent Labs  Lab 04-18-2018 1716  TROPONINI <0.03   ------------------------------------------------------------------------------------------------------------------  RADIOLOGY:  Dg Chest Port 1 View  Result Date: 2018/04/18 CLINICAL DATA:  Pt arrived via ems from home with hypotension and AMS. Pt has chronic UTI's, DM, and afib. Pt intia pressure b y home health RN 50/30, pressure increased with fluids to 92/64. Pt lethargic but in no distress.  Hx - cardiomyopathy, CHF, COPD, diabetes, HTN, CABG, non-smoker EXAM: PORTABLE CHEST 1 VIEW COMPARISON:  05/03/2009.  05/02/2009. FINDINGS: Stable changes from prior CABG surgery.  Cardiac silhouette is mildly enlarged. No mediastinal or hilar masses. No evidence of adenopathy. There is hazy airspace opacity in the left mid to lower lung. Remainder of the lungs is clear. Pleuroparenchymal scarring noted at the apices is stable. No convincing pleural effusion.  No pneumothorax. Skeletal structures are grossly intact. IMPRESSION: 1. Mild hazy ground-glass type opacity noted in the left mid to lower lung. Patient had similar opacities in this location on the prior exams. This may be chronic, or could reflect acute pneumonia. 2. No other evidence of an acute abnormality.  No pulmonary edema. 3. Status post CABG surgery.  Mild cardiomegaly. Electronically Signed   By: Amie Portland M.D.   On: 04/15/2018 17:38     ASSESSMENT AND PLAN:   84 year old male with past medical history of hypertension, hyperlipidemia, hearing loss, diabetes, COPD, osteoarthritis, macular degeneration who presented to the hospital due to relative hypotension and ruled in for sepsis.  1.  Sepsis-patient ruled in given her leukocytosis, elevated lactic acid, hypotension. -Source of sepsis is suspected to be a combination of UTI and pneumonia. - Continue broad-spectrum IV antibiotics with cefepime, vancomycin.  Follow cultures which are currently negative.  If MRSA PCR is negative will DC vancomycin. -Hypotension has improved.  2.  Urinary tract infection-based off a urinalysis on admission. -Continue IV cefepime, follow urine cultures.  3.  Acute on chronic renal failure-secondary to ATN from hypotension and sepsis. -Baseline creatinine is around 1.4-1.7 and now elevated to 2.0. -Continue IV fluids, follow BUN and creatinine urine output.  Renal dose meds, avoid nephrotoxins.  4.  Essential hypertension-blood pressure still on the soft side.  Continue to hold Toprol, ACE inhibitor for now.  5.  BPH-no urinary retention.  Continue finasteride.  6.  Hyperlipidemia-continue Pravachol.  7.  Diabetic  neuropathy-continue gabapentin.  8.  Diabetes type 2 without complication-continue glimepiride.  Await PT eval.   All the records are reviewed and case discussed with Care Management/Social Worker. Management plans discussed with the patient, family and they are in agreement.  CODE STATUS: Full code  DVT Prophylaxis: Hep SQ  TOTAL TIME TAKING CARE OF THIS PATIENT: 30 minutes.   POSSIBLE D/C IN 1-2 DAYS, DEPENDING ON CLINICAL CONDITION.   Houston Siren M.D on 04/06/2018 at 11:55 AM  Between 7am to 6pm - Pager - [phone]  After 6pm go to [email or web address] - Scientist, research (life sciences) [facility]  Office  [phone]  CC: Primary care physician; Mickey Farber, MD

## 2018-04-06 NOTE — Progress Notes (Signed)
PHARMACY - PHYSICIAN COMMUNICATION CRITICAL VALUE ALERT - BLOOD CULTURE IDENTIFICATION (BCID)  Results for orders placed or performed during the hospital encounter of 04/09/18  Blood Culture ID Panel (Reflexed) (Collected: 04/09/2018  5:17 PM)  Result Value Ref Range   Enterococcus species NOT DETECTED NOT DETECTED   Listeria monocytogenes NOT DETECTED NOT DETECTED   Staphylococcus species DETECTED (A) NOT DETECTED   Staphylococcus aureus (BCID) NOT DETECTED NOT DETECTED   Methicillin resistance NOT DETECTED NOT DETECTED   Streptococcus species NOT DETECTED NOT DETECTED   Streptococcus agalactiae NOT DETECTED NOT DETECTED   Streptococcus pneumoniae NOT DETECTED NOT DETECTED   Streptococcus pyogenes NOT DETECTED NOT DETECTED   Acinetobacter baumannii NOT DETECTED NOT DETECTED   Enterobacteriaceae species NOT DETECTED NOT DETECTED   Enterobacter cloacae complex NOT DETECTED NOT DETECTED   Escherichia coli NOT DETECTED NOT DETECTED   Klebsiella oxytoca NOT DETECTED NOT DETECTED   Klebsiella pneumoniae NOT DETECTED NOT DETECTED   Proteus species NOT DETECTED NOT DETECTED   Serratia marcescens NOT DETECTED NOT DETECTED   Haemophilus influenzae NOT DETECTED NOT DETECTED   Neisseria meningitidis NOT DETECTED NOT DETECTED   Pseudomonas aeruginosa NOT DETECTED NOT DETECTED   Candida albicans NOT DETECTED NOT DETECTED   Candida glabrata NOT DETECTED NOT DETECTED   Candida krusei NOT DETECTED NOT DETECTED   Candida parapsilosis NOT DETECTED NOT DETECTED   Candida tropicalis NOT DETECTED NOT DETECTED    Name of physician (or Provider) Contacted: Dr. Cherlynn Kaiser  Changes to prescribed antibiotics required: No change needed at this time.  Orinda Kenner, PharmD 04/06/2018  3:43 PM

## 2018-04-06 NOTE — Evaluation (Signed)
Physical Therapy Evaluation Patient Details Name: Fred Watson MRN: [id number] DOB: 1927-05-21 Today's Date: 04/06/2018   History of Present Illness  Fred Watson is an 84yo male who comes to [facility] on 10/[facility] after lethargy, weakness, and inability to stand/AMB. Pt admitted with Sepsis. PMH: recurrent UTI, cardiomyopathy, CHF, COPD, CAD, Dm2, HLD, HTN, Macular Degeneration.   Clinical Impression  Pt admitted with above diagnosis. Pt currently with functional limitations due to the deficits listed below (see "PT Problem List"). Upon entry, pt in bed, family exiting then return midsession. The pt is awake and agreeable to participate. The pt is alert and oriented x3, pleasant, minimally conversational (suspect HOH mediated), and following simple commands consistently. Pt unable to rise from bed without maxA lift assist and difficulty taking several pivot steps to chair. SpO2 drops to 83% after SPT to chair. Functional mobility assessment demonstrates increased effort/time requirements, poor tolerance, and need for physical assistance, whereas the patient performed these at a higher level of independence PTA. Pt will benefit from skilled PT intervention to increase independence and safety with basic mobility in preparation for discharge to the venue listed below.       Follow Up Recommendations Home health PT(may be working with HHPT currently, per family)    Equipment Recommendations  None recommended by PT    Recommendations for Other Services       Precautions / Restrictions Precautions Precautions: Fall Restrictions Weight Bearing Restrictions: No      Mobility  Bed Mobility Overal bed mobility: Needs Assistance Bed Mobility: Supine to Sit     Supine to sit: Supervision     General bed mobility comments: very weak, additional time and effort required, difficulty scooting forward prior to transfer.   Transfers Overall transfer level: Needs assistance Equipment used:  None Transfers: Sit to/from [employer] Sit to Stand: Max assist Stand pivot transfers: Mod assist(takes small steps, unable to balance independently)       General transfer comment: difficulty rising, but able to stabilitze holding onto therapist   Ambulation/Gait Ambulation/Gait assistance: (not appropriate at this time, very weak, anddesats after transfer to chair. )              Stairs            Wheelchair Mobility    Modified Rankin (Stroke Patients Only)       Balance Overall balance assessment: Needs assistance Sitting-balance support: Feet supported Sitting balance-Leahy Scale: Good     Standing balance support: Bilateral upper extremity supported;During functional activity Standing balance-Leahy Scale: Poor                               Pertinent Vitals/Pain Pain Assessment: No/denies pain    Home Living Family/patient expects to be discharged to:: Private residence Living Arrangements: Children(great-grandson, and his Son) Available Help at Discharge: Family;Available 24 hours/day Type of Home: House Home Access: Ramped entrance     Home Layout: One level Home Equipment: Walker - 4 wheels;Cane - single point;Shower seat - built in;Hand held Careers information officer - 2 wheels      Prior Function Level of Independence: Needs assistance   Gait / Transfers Assistance Needed: Mod I 4WW or cane in home  ADL's / Homemaking Assistance Needed: Pt able to manage most ADLs but required assistance with showering, cooking, transportation, and house keeping.  Supervision at all times due to decreased cognition and decreased safety awareness.  Comments: Pt states  he has needed more assistance in the last year. Will order OT evaluation for pt stated ADL difficulty.     Hand Dominance   Dominant Hand: Right    Extremity/Trunk Assessment   Upper Extremity Assessment Upper Extremity Assessment: Generalized weakness    Lower  Extremity Assessment Lower Extremity Assessment: Generalized weakness       Communication   Communication: HOH  Cognition Arousal/Alertness: Awake/alert Behavior During Therapy: WFL for tasks assessed/performed Overall Cognitive Status: Within Functional Limits for tasks assessed                                 General Comments: quite HOH even with hearing devices       General Comments      Exercises     Assessment/Plan    PT Assessment Patient needs continued PT services  PT Problem List Cardiopulmonary status limiting activity;Decreased activity tolerance;Decreased strength;Decreased balance       PT Treatment Interventions Stair training;Functional mobility training;Therapeutic activities;Therapeutic exercise;Gait training;Patient/family education    PT Goals (Current goals can be found in the Care Plan section)  Acute Rehab PT Goals Patient Stated Goal: improving breathing problem PT Goal Formulation: With patient Time For Goal Achievement: 04/13/18 Potential to Achieve Goals: Good    Frequency Min 2X/week   Barriers to discharge   steps to enter     Co-evaluation               AM-PAC PT "6 Clicks" Daily Activity  Outcome Measure Difficulty turning over in bed (including adjusting bedclothes, sheets and blankets)?: A Lot Difficulty moving from lying on back to sitting on the side of the bed? : A Lot Difficulty sitting down on and standing up from a chair with arms (e.g., wheelchair, bedside commode, etc,.)?: Unable Help needed moving to and from a bed to chair (including a wheelchair)?: Total Help needed walking in hospital room?: Total Help needed climbing 3-5 steps with a railing? : Total 6 Click Score: 8    End of Session Equipment Utilized During Treatment: Oxygen Activity Tolerance: Patient tolerated treatment well;Patient limited by fatigue;Treatment limited secondary to medical complications (Comment)(AF, desat) Patient left:  in chair;with call bell/phone within reach;with family/visitor present Nurse Communication: Mobility status PT Visit Diagnosis: Unsteadiness on feet (R26.81);Difficulty in walking, not elsewhere classified (R26.2);Muscle weakness (generalized) (M62.81)    Time: 1610-9604 PT Time Calculation (min) (ACUTE ONLY): 22 min   Charges:   PT Evaluation $PT Eval Moderate Complexity: 1 Mod          2:16 PM, 04/06/18 Rosamaria Lints, PT, DPT Physical Therapist - [facility]  [phone] (ASCOM)     Buccola,Allan C 04/06/2018, 2:14 PM

## 2018-04-07 LAB — GLUCOSE, CAPILLARY
GLUCOSE-CAPILLARY: 190 mg/dL — AB (ref 70–99)
GLUCOSE-CAPILLARY: 189 mg/dL — AB (ref 70–99)
Glucose-Capillary: 165 mg/dL — ABNORMAL HIGH (ref 70–99)
Glucose-Capillary: 202 mg/dL — ABNORMAL HIGH (ref 70–99)

## 2018-04-07 LAB — CBC
HEMATOCRIT: 35.6 % — AB (ref 40.0–52.0)
Hemoglobin: 11.6 g/dL — ABNORMAL LOW (ref 13.0–18.0)
MCH: 31.8 pg (ref 26.0–34.0)
MCHC: 32.6 g/dL (ref 32.0–36.0)
MCV: 97.5 fL (ref 80.0–100.0)
PLATELETS: 215 10*3/uL (ref 150–440)
RBC: 3.65 MIL/uL — AB (ref 4.40–5.90)
RDW: 13.9 % (ref 11.5–14.5)
WBC: 16.3 10*3/uL — AB (ref 3.8–10.6)

## 2018-04-07 LAB — BASIC METABOLIC PANEL
ANION GAP: 9 (ref 5–15)
BUN: 43 mg/dL — ABNORMAL HIGH (ref 8–23)
CO2: 23 mmol/L (ref 22–32)
Calcium: 7.7 mg/dL — ABNORMAL LOW (ref 8.9–10.3)
Chloride: 106 mmol/L (ref 98–111)
Creatinine, Ser: 1.5 mg/dL — ABNORMAL HIGH (ref 0.61–1.24)
GFR calc Af Amer: 46 mL/min — ABNORMAL LOW (ref >60)
GFR, EST NON AFRICAN AMERICAN: 40 mL/min — AB (ref >60)
Glucose, Bld: 186 mg/dL — ABNORMAL HIGH (ref 70–99)
POTASSIUM: 4.3 mmol/L (ref 3.5–5.1)
Sodium: 138 mmol/L (ref 135–145)

## 2018-04-07 MED ORDER — SODIUM CHLORIDE 0.9 % IV SOLN
2.0000 g | INTRAVENOUS | Status: DC
  Administered 2018-04-07 – 2018-04-08 (×2): 2 g via INTRAVENOUS
  Filled 2018-04-07 (×2): qty 2

## 2018-04-07 MED ORDER — SODIUM CHLORIDE 0.9 % IV SOLN
INTRAVENOUS | Status: DC | PRN
  Administered 2018-04-07: 17:00:00 via INTRAVENOUS
  Administered 2018-04-10: 10 mL/h via INTRAVENOUS

## 2018-04-07 MED ORDER — SODIUM CHLORIDE 0.9 % IV SOLN
2.0000 g | INTRAVENOUS | Status: DC
  Filled 2018-04-07: qty 2

## 2018-04-07 MED ORDER — DILTIAZEM HCL 25 MG/5ML IV SOLN
10.0000 mg | Freq: Once | INTRAVENOUS | Status: AC
  Administered 2018-04-07: 10 mg via INTRAVENOUS
  Filled 2018-04-07: qty 5

## 2018-04-07 NOTE — Clinical Social Work Note (Signed)
Clinical Social Work Assessment  Patient Details  Name: Fred Watson MRN: [id number] Date of Birth: 11-Feb-1927  Date of referral:  04/07/18               Reason for consult:  Facility Placement                Permission sought to share information with:  Case Manager, Customer service manager, Family Supports Permission granted to share information::  Yes, Verbal Permission Granted  Name::      SNF  Agency::   [facility]   Relationship::     Contact Information:     Housing/Transportation Living arrangements for the past 2 months:  [place] of Information:  Adult Children Patient Interpreter Needed:  None Criminal Activity/Legal Involvement Pertinent to Current Situation/Hospitalization:  No - Comment as needed Significant Relationships:  Adult Children Lives with:  Adult Children Do you feel safe going back to the place where you live?  Yes Need for family participation in patient care:  Yes (Comment)  Care giving concerns:  Patient lives with his son in [facility]    Social Worker assessment / plan:  CSW notified by [facility] that PT has recommended SNF. CSW met with patient and son Fred Watson at bedside. CSW explained role. CSW explained that PT is recommending SNF for rehab. Patient and son are in agreement. CSW will initiate bed search and give bed offers once received. CSW will continue to follow for discharge planning.   Employment status:  Retired Nurse, adult PT Recommendations:  [place] / Referral to community resources:  [place]  Patient/Family's Response to care:  Son thanked CSW for assistance   Patient/Family's Understanding of and Emotional Response to Diagnosis, Current Treatment, and Prognosis:  Patient and son in agreement with discharge plan   Emotional Assessment Appearance:  Appears stated age Attitude/Demeanor/Rapport:    Affect (typically observed):  Accepting,  Quiet, Pleasant Orientation:  Oriented to Self, Oriented to Place Alcohol / Substance use:  Not Applicable Psych involvement (Current and /or in the community):  No (Comment)  Discharge Needs  Concerns to be addressed:  Discharge Planning Concerns Readmission within the last 30 days:  No Current discharge risk:  None Barriers to Discharge:  Continued Medical Work up   [employer], [place] 04/07/2018, 3:19 PM

## 2018-04-07 NOTE — Progress Notes (Signed)
[employer] - [place] at [facility]      PATIENT NAME: Fred Watson    MR#:  [id number]  DATE OF BIRTH:  01-29-1927  SUBJECTIVE:   Since creatinine is improving, urine cultures positive for Citrobacter but sensitivities are pending.  Patient still complains of generalized weakness and malaise.  Physical therapy is recommending short-term rehab.  REVIEW OF SYSTEMS:    Review of Systems  Constitutional: Positive for malaise/fatigue. Negative for chills and fever.  HENT: Negative for congestion and tinnitus.   Eyes: Negative for blurred vision and double vision.  Respiratory: Negative for cough, shortness of breath and wheezing.   Cardiovascular: Negative for chest pain, orthopnea and PND.  Gastrointestinal: Negative for abdominal pain, diarrhea, nausea and vomiting.  Genitourinary: Negative for dysuria and hematuria.  Neurological: Positive for weakness (generalized. ). Negative for dizziness, sensory change and focal weakness.  All other systems reviewed and are negative.   Nutrition: Heart Healthy/Carb modified Tolerating Diet: Yes Tolerating PT: Await Eval.    DRUG ALLERGIES:   Allergies  Allergen Reactions  . Ezetimibe Other (See Comments)  . Niacin Other (See Comments)    Flushing  . Ace Inhibitors Cough    Tolerating lisinopril 20 mg without side effects 02/01/2015  . Atorvastatin Other (See Comments)    Nightmares  . Losartan Other (See Comments)    VITALS:  Blood pressure (!) 130/55, pulse 75, temperature 98.3 F (36.8 C), temperature source Oral, resp. rate 18, height 6' (1.829 m), weight 79.4 kg, SpO2 96 %.  PHYSICAL EXAMINATION:   Physical Exam  GENERAL:  84 y.o.-year-old patient lying in bed very hard of hearing but in NAD.  EYES: Pupils equal, round, reactive to light and accommodation. No scleral icterus. Extraocular muscles intact.  HEENT: Head atraumatic, normocephalic. Oropharynx and nasopharynx clear.  NECK:  Supple, no jugular  venous distention. No thyroid enlargement, no tenderness.  LUNGS: Normal breath sounds bilaterally, no wheezing, rales, rhonchi. No use of accessory muscles of respiration.  CARDIOVASCULAR: S1, S2 normal. No murmurs, rubs, or gallops.  ABDOMEN: Soft, nontender, nondistended. Bowel sounds present. No organomegaly or mass.  EXTREMITIES: No cyanosis, clubbing or edema b/l.    NEUROLOGIC: Cranial nerves II through XII are intact. No focal Motor or sensory deficits b/l.Globally weak.    PSYCHIATRIC: The patient is alert and oriented x 1.  SKIN: No obvious rash, lesion, or ulcer.    LABORATORY PANEL:   CBC Recent Labs  Lab 04/07/18 0502  WBC 16.3*  HGB 11.6*  HCT 35.6*  PLT 215   ------------------------------------------------------------------------------------------------------------------  Chemistries  Recent Labs  Lab 04/01/2018 1716  04/07/18 0502  NA 137   < > 138  K 5.4*   < > 4.3  CL 103   < > 106  CO2 24   < > 23  GLUCOSE 173*   < > 186*  BUN 53*   < > 43*  CREATININE 2.47*   < > 1.50*  CALCIUM 8.1*   < > 7.7*  AST 26  --   --   ALT 20  --   --   ALKPHOS 88  --   --   BILITOT 0.8  --   --    < > = values in this interval not displayed.   ------------------------------------------------------------------------------------------------------------------  Cardiac Enzymes Recent Labs  Lab 04/18/2018 1716  TROPONINI <0.03   ------------------------------------------------------------------------------------------------------------------  RADIOLOGY:  Dg Chest Port 1 View  Result Date: 04/15/2018 CLINICAL DATA:  Pt arrived via ems  from home with hypotension and AMS. Pt has chronic UTI's, DM, and afib. Pt intia pressure b y home health RN 50/30, pressure increased with fluids to 92/64. Pt lethargic but in no distress. Hx - cardiomyopathy, CHF, COPD, diabetes, HTN, CABG, non-smoker EXAM: PORTABLE CHEST 1 VIEW COMPARISON:  05/03/2009.  05/02/2009. FINDINGS: Stable changes  from prior CABG surgery. Cardiac silhouette is mildly enlarged. No mediastinal or hilar masses. No evidence of adenopathy. There is hazy airspace opacity in the left mid to lower lung. Remainder of the lungs is clear. Pleuroparenchymal scarring noted at the apices is stable. No convincing pleural effusion.  No pneumothorax. Skeletal structures are grossly intact. IMPRESSION: 1. Mild hazy ground-glass type opacity noted in the left mid to lower lung. Patient had similar opacities in this location on the prior exams. This may be chronic, or could reflect acute pneumonia. 2. No other evidence of an acute abnormality.  No pulmonary edema. 3. Status post CABG surgery.  Mild cardiomegaly. Electronically Signed   By: Amie Portland M.D.   On: 04/19/2018 17:38     ASSESSMENT AND PLAN:   84 year old male with past medical history of hypertension, hyperlipidemia, hearing loss, diabetes, COPD, osteoarthritis, macular degeneration who presented to the hospital due to relative hypotension and ruled in for sepsis.  1.  Sepsis-patient ruled in given his leukocytosis, elevated lactic acid, hypotension. - Source of sepsis is suspected to be a combination of UTI and pneumonia. - Continue broad-spectrum IV antibiotics with cefepime, and d/c Vanc as MRSA PCR is (-).  -Patient's hypotension has resolved, urine cultures are positive for Citrobacter but sensitivities pending.  Blood cultures are consistent with contamination.  2.  Urinary tract infection-based off a urinalysis on admission. -Continue IV cefepime, follow sensitivities on Urine culture.   3.  Acute on chronic renal failure-secondary to ATN from hypotension and sepsis. - Baseline creatinine is around 1.4-1.7 and Cr. Back to baseline now.  - Follow BUN and creatinine and urine output.  4.  Tachycardia-patient developed some A. fib with RVR this morning likely secondary to underlying sepsis and UTI. - Given his oral dose of metoprolol and remained  tachycardic, given 1 dose of Cardizem and now has converted to a sinus rhythm.  We will continue to monitor.  5.  Essential hypertension- cont. Toprol as hypotension has resolved.   6.  BPH-no urinary retention.  Continue finasteride.  7.  Hyperlipidemia-continue Pravachol.  8.  Diabetic neuropathy-continue gabapentin.  9.  Diabetes type 2 without complication-continue glimepiride.  PT eval noted.  Will make Social work aware.   All the records are reviewed and case discussed with Care Management/Social Worker. Management plans discussed with the patient, family and they are in agreement.  CODE STATUS: Full code  DVT Prophylaxis: Hep SQ  TOTAL TIME TAKING CARE OF THIS PATIENT: 30 minutes.   POSSIBLE D/C IN 1-2 DAYS, DEPENDING ON CLINICAL CONDITION.   Houston Siren M.D on 04/07/2018 at 2:25 PM  Between 7am to 6pm - Pager - [phone]  After 6pm go to [email or web address] - Scientist, research (life sciences) [place] [facility]  Office  [phone]  CC: Primary care physician; Mickey Farber, MD

## 2018-04-07 NOTE — Progress Notes (Signed)
Physical Therapy Treatment Patient Details Name: Fred Watson MRN: [id number] DOB: 04-Mar-1927 Today's Date: 04/07/2018    History of Present Illness Fred Watson is an 84yo male who comes [facility] on 10/5 after lethargy, weakness, and inability to stand/AMB. Pt admitted with Sepsis. PMH: recurrent UTI, cardiomyopathy, CHF, COPD, CAD, Dm2, HLD, HTN, Macular Degeneration.     PT Comments    Pt presents with deficits in strength, transfers, mobility, gait, balance, and activity tolerance.  Pt required min A with bed mobility tasks and then mod A to stand from a significantly elevated height.  Pt able to amb 2-3 feet with a RW with flexed trunk posture during amb with B knees remaining partially flexed while taking very small steps from the bed to the chair.  Pt relied heavily on BUE support while in standing.  Once pt was in recliner attempted another sit to stand transfer with pt unable to stand again secondary to fatigue.  Pt's SpO2 on 2LO2/min remained >/= 92% throughout the session with HR ranging from the high 90s at rest to 116 bpm after amb.  Overall pt remains very weak functionally and is at a high risk for falls.  Pt will benefit from PT services in a SNF setting upon discharge to safely address above deficits for decreased caregiver assistance and eventual return to PLOF.     Follow Up Recommendations  SNF;Supervision for mobility/OOB     Equipment Recommendations  Other (comment)(TBD at next venue of care)    Recommendations for Other Services       Precautions / Restrictions Precautions Precautions: Fall Restrictions Weight Bearing Restrictions: No    Mobility  Bed Mobility Overal bed mobility: Needs Assistance Bed Mobility: Supine to Sit     Supine to sit: Min assist     General bed mobility comments: Min A for BLEs out of bed and upper body to full upright position  Transfers Overall transfer level: Needs assistance Equipment used: Rolling walker (2  wheeled) Transfers: Sit to/from Stand Sit to Stand: Mod assist;From elevated surface         General transfer comment: Max verbal cues for sequencing with poor carryover and then Mod A to stand from an elevated EOB  Ambulation/Gait Ambulation/Gait assistance: Min guard Gait Distance (Feet): 2 Feet Assistive device: Rolling walker (2 wheeled) Gait Pattern/deviations: Trunk flexed;Step-through pattern;Decreased step length - right;Decreased step length - left;Shuffle Gait velocity: Decreased   General Gait Details: Flexed trunk posture during amb with B knees remaining partially flexed while taking very small steps from the bed to the chair   Stairs             Wheelchair Mobility    Modified Rankin (Stroke Patients Only)       Balance Overall balance assessment: Needs assistance Sitting-balance support: Feet supported Sitting balance-Leahy Scale: Good     Standing balance support: Bilateral upper extremity supported;During functional activity Standing balance-Leahy Scale: Poor Standing balance comment: Very heavy reliance on BUEs for support while in standing                            Cognition Arousal/Alertness: Awake/alert Behavior During Therapy: WFL for tasks assessed/performed Overall Cognitive Status: Within Functional Limits for tasks assessed  Exercises Total Joint Exercises Ankle Circles/Pumps: AROM;Both;10 reps;15 reps Quad Sets: Strengthening;Both;10 reps;15 reps Gluteal Sets: Strengthening;Both;10 reps;15 reps Hip ABduction/ADduction: AROM;Both;5 reps;10 reps Straight Leg Raises: AROM;Both;5 reps;10 reps Long Arc Quad: AROM;Both;10 reps;15 reps Knee Flexion: AROM;Both;10 reps;15 reps Marching in Standing: AROM;Both;5 reps;Standing    General Comments        Pertinent Vitals/Pain Pain Assessment: No/denies pain    Home Living                      Prior Function             PT Goals (current goals can now be found in the care plan section) Progress towards PT goals: Progressing toward goals    Frequency    Min 2X/week      PT Plan Discharge plan needs to be updated    Co-evaluation              AM-PAC PT "6 Clicks" Daily Activity  Outcome Measure                   End of Session Equipment Utilized During Treatment: Gait belt;Oxygen Activity Tolerance: Patient tolerated treatment well Patient left: in chair;with call bell/phone within reach;with chair alarm set Nurse Communication: Mobility status PT Visit Diagnosis: Unsteadiness on feet (R26.81);Difficulty in walking, not elsewhere classified (R26.2);Muscle weakness (generalized) (M62.81)     Time: 6962-9528 PT Time Calculation (min) (ACUTE ONLY): 27 min  Charges:  $Therapeutic Exercise: 8-22 mins $Therapeutic Activity: 8-22 mins                     D. Scott Maddux Vanscyoc PT, DPT 04/07/18, 11:16 AM

## 2018-04-07 NOTE — Care Management Note (Signed)
Case Management Note  Patient Details  Name: MATTIA LIFORD MRN: [id number] Date of Birth: 1926-10-16  Subjective/Objective:  Admitted to [facility] with the diagnosis of sepsis. Lives with son [name] [phone]). Seen Dr, Harrington Challenger about 21-2 weeks ago,.Prescriprtions are filled at [facility] in [place].  Currently active with [facility]. This was arranged 01/2018  When discharged from this facility.  No skilled nursing.  Takes care of all basic activities of daily living himself.                    Action/Plan: Physical therapy is recommending home with home health and physical therapy.  Resumption of orders when discharged   Expected Discharge Date:                  Expected Discharge Plan:     In-House Referral:   yes  Discharge planning Services  CM Consult  Post Acute Care Choice:  Resumption of Svcs/PTA Provider, Home Health Choice offered to:     DME Arranged:    DME Agency:     HH Arranged:    HH Agency:  [facility]  Status of Service:  In process, will continue to follow  If discussed at Long Length of Stay Meetings, dates discussed:    Additional Comments:  Gwenette Greet, RN MSN CCM Care Management [phone] 04/07/2018, 9:58 AM

## 2018-04-07 NOTE — Progress Notes (Signed)
Pt A&Ox2. Agitated at times, wanting to go home. Dyspnea with exertion. Remains on 2L O2. Hands are slightly more red than on admission. Incontinent at times, requiring assistance with urination. VS stable. Will continue to monitor.

## 2018-04-07 NOTE — NC FL2 (Signed)
[place] MEDICAID FL2 LEVEL OF CARE SCREENING TOOL     IDENTIFICATION  Patient Name: Fred Watson Birthdate: 1927-01-06 Sex: male Admission Date (Current Location): 04/29/2018  [place] and [place] Number:  Chiropodist and Address:  [facility], [address], [place], [place] [address]      Provider Number: [id number]  Attending Physician Name and Address:  Houston Siren, MD  Relative Name and Phone Number:  Kennard Fildes- son [phone]    Current Level of Care: Hospital Recommended Level of Care: Skilled Nursing Facility Prior Approval Number:    Date Approved/Denied:   PASRR Number: [id number]  Discharge Plan: SNF    Current Diagnoses: Patient Active Problem List   Diagnosis Date Noted  . Sepsis (HCC) April 29, 2018  . Hypoglycemia 02/22/2018    Orientation RESPIRATION BLADDER Height & Weight     Self, Time, Place  O2(2 liters ) Continent Weight: 175 lb (79.4 kg) Height:  6' (182.9 cm)  BEHAVIORAL SYMPTOMS/MOOD NEUROLOGICAL BOWEL NUTRITION STATUS  (none) (none) Continent Diet(Heart Healthy )  AMBULATORY STATUS COMMUNICATION OF NEEDS Skin   Extensive Assist Verbally Normal                       Personal Care Assistance Level of Assistance  Bathing, Feeding, Dressing Bathing Assistance: Limited assistance Feeding assistance: Independent Dressing Assistance: Limited assistance     Functional Limitations Info  Sight, Hearing, Speech Sight Info: Adequate Hearing Info: Adequate Speech Info: Adequate    SPECIAL CARE FACTORS FREQUENCY  PT (By licensed PT), OT (By licensed OT)     PT Frequency: 5 OT Frequency: 5            Contractures Contractures Info: Not present    Additional Factors Info  Code Status, Allergies Code Status Info: Full Code  Allergies Info: Ezetimibe, Niacin, Ace Inhibitors, Atorvastatin, Losartan           Current Medications (04/07/2018):  This is the current hospital active  medication list Current Facility-Administered Medications  Medication Dose Route Frequency Provider Last Rate Last Dose  . acetaminophen (TYLENOL) tablet 650 mg  650 mg Oral Q6H PRN Ihor Austin, MD       Or  . acetaminophen (TYLENOL) suppository 650 mg  650 mg Rectal Q6H PRN Pyreddy, Vivien Rota, MD      . aspirin EC tablet 81 mg  81 mg Oral Daily Pyreddy, Vivien Rota, MD   81 mg at 04/07/18 0832  . ceFEPIme (MAXIPIME) 2 g in sodium chloride 0.9 % 100 mL IVPB  2 g Intravenous Q24H Hallaji, Sheema M, RPH      . cholecalciferol (VITAMIN D) tablet 1,000 Units  1,000 Units Oral Daily Ihor Austin, MD   1,000 Units at 04/07/18 0830  . docusate sodium (COLACE) capsule 100 mg  100 mg Oral BID Ihor Austin, MD   100 mg at 04/07/18 0832  . finasteride (PROSCAR) tablet 5 mg  5 mg Oral Daily Pyreddy, Vivien Rota, MD   5 mg at 04/07/18 0832  . FLUoxetine (PROZAC) capsule 10 mg  10 mg Oral Daily Pyreddy, Vivien Rota, MD   10 mg at 04/07/18 0831  . gabapentin (NEURONTIN) capsule 300 mg  300 mg Oral BID Ihor Austin, MD   300 mg at 04/07/18 0831  . glimepiride (AMARYL) tablet 2 mg  2 mg Oral Q breakfast Pyreddy, Pavan, MD   2 mg at 04/07/18 0834  . heparin injection 5,000 Units  5,000 Units Subcutaneous Q8H Pyreddy,  Vivien Rota, MD   5,000 Units at 04/07/18 0543  . insulin aspart (novoLOG) injection 0-9 Units  0-9 Units Subcutaneous TID WC Houston Siren, MD   3 Units at 04/07/18 1234  . metoprolol succinate (TOPROL-XL) 24 hr tablet 100 mg  100 mg Oral Daily Pyreddy, Vivien Rota, MD   100 mg at 04/07/18 0837  . ondansetron (ZOFRAN) tablet 4 mg  4 mg Oral Q6H PRN Ihor Austin, MD       Or  . ondansetron (ZOFRAN) injection 4 mg  4 mg Intravenous Q6H PRN Pyreddy, Pavan, MD      . pravastatin (PRAVACHOL) tablet 40 mg  40 mg Oral Daily Pyreddy, Vivien Rota, MD   40 mg at 04/07/18 0831  . senna-docusate (Senokot-S) tablet 1 tablet  1 tablet Oral QHS PRN Ihor Austin, MD         Discharge Medications: Please see discharge summary for a  list of discharge medications.  Relevant Imaging Results:  Relevant Lab Results:   Additional Information SSN: [id number]  Ruthe Mannan, [place]

## 2018-04-08 ENCOUNTER — Inpatient Hospital Stay: Payer: Medicare Other

## 2018-04-08 ENCOUNTER — Inpatient Hospital Stay
Admit: 2018-04-08 | Discharge: 2018-04-08 | Disposition: A | Discharge status: Home / Self Care | Payer: Medicare Other | Attending: Specialist | Admitting: Specialist

## 2018-04-08 DIAGNOSIS — R0603 Acute respiratory distress: Secondary | ICD-10-CM

## 2018-04-08 LAB — URINE CULTURE: Culture: >=100000 — AB

## 2018-04-08 LAB — CULTURE, BLOOD (ROUTINE X 2): SPECIAL REQUESTS: ADEQUATE

## 2018-04-08 LAB — BASIC METABOLIC PANEL
ANION GAP: 6 (ref 5–15)
BUN: 42 mg/dL — ABNORMAL HIGH (ref 8–23)
CHLORIDE: 106 mmol/L (ref 98–111)
CO2: 25 mmol/L (ref 22–32)
Calcium: 8.1 mg/dL — ABNORMAL LOW (ref 8.9–10.3)
Creatinine, Ser: 1.49 mg/dL — ABNORMAL HIGH (ref 0.61–1.24)
GFR, EST AFRICAN AMERICAN: 46 mL/min — AB (ref >60)
GFR, EST NON AFRICAN AMERICAN: 40 mL/min — AB (ref >60)
Glucose, Bld: 201 mg/dL — ABNORMAL HIGH (ref 70–99)
POTASSIUM: 4.7 mmol/L (ref 3.5–5.1)
SODIUM: 137 mmol/L (ref 135–145)

## 2018-04-08 LAB — BLOOD GAS, ARTERIAL
ACID-BASE DEFICIT: 4.6 mmol/L — AB (ref 0.0–2.0)
BICARBONATE: 23.7 mmol/L (ref 20.0–28.0)
FIO2: 0.36
O2 SAT: 87.6 %
PATIENT TEMPERATURE: 37
PCO2 ART: 58 mmHg — AB (ref 32.0–48.0)
PO2 ART: 65 mmHg — AB (ref 83.0–108.0)
pH, Arterial: 7.22 — ABNORMAL LOW (ref 7.350–7.450)

## 2018-04-08 LAB — CBC
HCT: 33.4 % — ABNORMAL LOW (ref 40.0–52.0)
HEMOGLOBIN: 11.4 g/dL — AB (ref 13.0–18.0)
MCH: 32.3 pg (ref 26.0–34.0)
MCHC: 34.1 g/dL (ref 32.0–36.0)
MCV: 94.7 fL (ref 80.0–100.0)
Platelets: 213 10*3/uL (ref 150–440)
RBC: 3.53 MIL/uL — AB (ref 4.40–5.90)
RDW: 13.3 % (ref 11.5–14.5)
WBC: 13 10*3/uL — AB (ref 3.8–10.6)

## 2018-04-08 LAB — GLUCOSE, CAPILLARY
GLUCOSE-CAPILLARY: 174 mg/dL — AB (ref 70–99)
GLUCOSE-CAPILLARY: 204 mg/dL — AB (ref 70–99)
Glucose-Capillary: 192 mg/dL — ABNORMAL HIGH (ref 70–99)
Glucose-Capillary: 142 mg/dL — ABNORMAL HIGH (ref 70–99)

## 2018-04-08 MED ORDER — LABETALOL HCL 5 MG/ML IV SOLN
10.0000 mg | INTRAVENOUS | Status: DC | PRN
  Administered 2018-04-08 – 2018-04-13 (×2): 10 mg via INTRAVENOUS
  Filled 2018-04-08 (×2): qty 4

## 2018-04-08 MED ORDER — FUROSEMIDE 10 MG/ML IJ SOLN
80.0000 mg | Freq: Once | INTRAMUSCULAR | Status: AC
  Administered 2018-04-08: 80 mg via INTRAVENOUS
  Filled 2018-04-08: qty 8

## 2018-04-08 MED ORDER — IPRATROPIUM-ALBUTEROL 0.5-2.5 (3) MG/3ML IN SOLN
3.0000 mL | Freq: Four times a day (QID) | RESPIRATORY_TRACT | Status: DC
  Administered 2018-04-08 – 2018-04-15 (×29): 3 mL via RESPIRATORY_TRACT
  Filled 2018-04-08 (×29): qty 3

## 2018-04-08 MED ORDER — FUROSEMIDE 10 MG/ML IJ SOLN
40.0000 mg | Freq: Once | INTRAMUSCULAR | Status: AC
  Administered 2018-04-08: 40 mg via INTRAVENOUS
  Filled 2018-04-08: qty 4

## 2018-04-08 MED ORDER — FUROSEMIDE 40 MG PO TABS
40.0000 mg | ORAL_TABLET | Freq: Every day | ORAL | Status: DC
  Administered 2018-04-08 – 2018-04-10 (×3): 40 mg via ORAL
  Filled 2018-04-08: qty 1
  Filled 2018-04-08: qty 2
  Filled 2018-04-08: qty 1

## 2018-04-08 MED ORDER — SODIUM CHLORIDE 0.9 % IV SOLN
2.0000 g | Freq: Two times a day (BID) | INTRAVENOUS | Status: DC
  Administered 2018-04-09: 2 g via INTRAVENOUS
  Filled 2018-04-08 (×2): qty 2

## 2018-04-08 MED ORDER — METHYLPREDNISOLONE SODIUM SUCC 125 MG IJ SOLR
60.0000 mg | Freq: Four times a day (QID) | INTRAMUSCULAR | Status: DC
  Administered 2018-04-08 – 2018-04-10 (×8): 60 mg via INTRAVENOUS
  Filled 2018-04-08 (×8): qty 2

## 2018-04-08 MED ORDER — DILTIAZEM HCL-DEXTROSE 100-5 MG/100ML-% IV SOLN (PREMIX)
5.0000 mg/h | INTRAVENOUS | Status: DC
  Administered 2018-04-08: 5 mg/h via INTRAVENOUS
  Administered 2018-04-08: 10 mg/h via INTRAVENOUS
  Filled 2018-04-08: qty 100

## 2018-04-08 NOTE — Progress Notes (Signed)
Pt had blood pressure of 180/87. Provider was notified and lavetalol 10 mg IV orders given. I reassessed after 1 hour, pt blood pressure was 152/79 but will continue to monitor.

## 2018-04-08 NOTE — Progress Notes (Signed)
Pharmacy Antibiotic Note  Fred Watson is a 84 y.o. male admitted on 04/03/2018 with sepsis. Patient admitted to ICU with acute respiratory distress on 10/8. Pharmacy has been consulted for cefepime dosing.  Plan: In setting of respiratory failure will transition cefepime 2g IV Q12hr.   Height: 6' (182.9 cm) Weight: 180 lb 1.9 oz (81.7 kg) IBW/kg (Calculated) : 77.6  Temp (24hrs), Avg:97.9 F (36.6 C), Min:97.5 F (36.4 C), Max:98.3 F (36.8 C)  Recent Labs  Lab 04/15/2018 1716 04/07/2018 2108 04/06/18 0434 04/07/18 0502 04/08/18 0646  WBC 24.7*  --  21.1* 16.3* 13.0*  CREATININE 2.47*  --  2.02* 1.50* 1.49*  LATICACIDVEN 2.0* 1.4  --   --   --     Estimated Creatinine Clearance: 36.9 mL/min (A) (by C-G formula based on SCr of 1.49 mg/dL (H)).    Allergies  Allergen Reactions  . Ezetimibe Other (See Comments)  . Niacin Other (See Comments)    Flushing  . Ace Inhibitors Cough    Tolerating lisinopril 20 mg without side effects 02/01/2015  . Atorvastatin Other (See Comments)    Nightmares  . Losartan Other (See Comments)    Antimicrobials this admission: Azithromycin 10/5 x 1 Vancomycin 10/6 >> 10/6  Cefepime 10/5 >>   Dose adjustments this admission: 10/6 cefepime transitioned to 1g Q24h 10/7 cefepime transitioned to 2g Q24hr  10/8 cefepime transitioned to 2g Q12hr  Microbiology results: 10/5 BCx: coag negative staphylococcus  10/5 UCx: pansensitive Citrobacter Koseri  10/6 MRSA PCR: negative   Thank you for allowing pharmacy to be a part of this patient's care.  Simpson,Michael L 04/08/2018 5:30 PM

## 2018-04-08 NOTE — Progress Notes (Signed)
Patient has been restless and agitated all morning with labored breathing.  Patient heart rate in the 120s-150s.  Dr. Cherlynn Kaiser at bedside and aware.  New orders received.  Patient transferring to ICU 14.  Report given to Anette Guarneri, Charity fundraiser and transported to ICU by Merry Proud, RN and Ramona, [place].  Patient's son [name] made aware of transfer by this nurse via phone.  Orson Ape, RN, BSN

## 2018-04-08 NOTE — Care Management Important Message (Signed)
Important Message  Patient Details  Name: Fred Watson MRN: [id number] Date of Birth: 11-27-1926   Medicare Important Message Given:  Yes    Olegario Messier A Jagdeep Ancheta 04/08/2018, 10:13 AM

## 2018-04-08 NOTE — Consult Note (Signed)
Reason for Consult:Respiratory Failure  Referring Physician: Dr. Quincy Carnes is an 84 y.o. male.  HPI: Mr. Tierce is an 84 year old gentleman with a past medical history remarkable for coronary artery disease, ischemic cardiomyopathy with subsequent systolic heart failure, hypertension, hyperlipidemia, macular degeneration, hearing loss, COPD, initially was admitted for generalized weakness, found to be uroseptic also had left sided pneumonia, renal insufficiency and hyperkalemia.  He was initially admitted, started on vancomycin and cefepime, received fluid resuscitation with close follow-up of laboratory and electrolytes him a urinalysis was positive for Citrobacter, which continued on cefepime.  This morning patient developed increasing heart rate, felt to be in atrial fibrillation with rapid ventricular response, hypertension, aggressive respiratory insufficiency with elevated PCO2 at 58 and pH 7.22.  Clinically on exam patient is diffusely wheezing confused, with tachycardia.  Patient is being moved to the intensive care unit  Past Medical History:  Diagnosis Date  . Arthritis   . Cardiomyopathy ([place])   . CHF (congestive heart failure) ([place])   . COPD (chronic obstructive pulmonary disease) ([place])   . Coronary artery disease   . Depression   . Diabetes mellitus without complication ([place])   . Hearing loss   . Hyperlipidemia   . Hypertension   . Macular degeneration     Past Surgical History:  Procedure Laterality Date  . CATARACT EXTRACTION W/PHACO Left 11/12/2017   Procedure: CATARACT EXTRACTION PHACO AND INTRAOCULAR LENS PLACEMENT ([place]) LEFT DIABETIC;  Surgeon: Eulogio Bear, MD;  Location: [facility];  Service: Ophthalmology;  Laterality: Left;  Diabetic  . CHOLECYSTECTOMY    . CORONARY ARTERY BYPASS GRAFT    . HERNIA REPAIR      Family History  Problem Relation Age of Onset  . Stroke Mother   . CAD Father   . Heart attack Father     Social History:   reports that he quit smoking about 60 years ago. He smoked 1.00 pack per day. He has never used smokeless tobacco. His alcohol and drug histories are not on file.  Allergies:  Allergies  Allergen Reactions  . Ezetimibe Other (See Comments)  . Niacin Other (See Comments)    Flushing  . Ace Inhibitors Cough    Tolerating lisinopril 20 mg without side effects 02/01/2015  . Atorvastatin Other (See Comments)    Nightmares  . Losartan Other (See Comments)    Medications: I have reviewed the patient's current medications.  Results for orders placed or performed during the hospital encounter of 04/04/2018 (from the past 48 hour(s))  Glucose, capillary     Status: Abnormal   Collection Time: 04/06/18 11:36 AM  Result Value Ref Range   Glucose-Capillary 197 (H) 70 - 99 mg/dL  MRSA PCR Screening     Status: None   Collection Time: 04/06/18  3:25 PM  Result Value Ref Range   MRSA by PCR NEGATIVE NEGATIVE    Comment:        The GeneXpert MRSA Assay (FDA approved for NASAL specimens only), is one component of a comprehensive MRSA colonization surveillance program. It is not intended to diagnose MRSA infection nor to guide or monitor treatment for MRSA infections. Performed at [facility], [address] [place], [place] [address]   Glucose, capillary     Status: Abnormal   Collection Time: 04/06/18  5:10 PM  Result Value Ref Range   Glucose-Capillary 130 (H) 70 - 99 mg/dL  Glucose, capillary     Status: Abnormal   Collection  Time: 04/06/18  8:37 PM  Result Value Ref Range   Glucose-Capillary 126 (H) 70 - 99 mg/dL  CBC     Status: Abnormal   Collection Time: 04/07/18  5:02 AM  Result Value Ref Range   WBC 16.3 (H) 3.8 - 10.6 K/uL   RBC 3.65 (L) 4.40 - 5.90 MIL/uL   Hemoglobin 11.6 (L) 13.0 - 18.0 g/dL   HCT 35.6 (L) 40.0 - 52.0 %   MCV 97.5 80.0 - 100.0 fL   MCH 31.8 26.0 - 34.0 pg   MCHC 32.6 32.0 - 36.0 g/dL   RDW 13.9 11.5 - 14.5 %   Platelets 215 150 - 440 K/uL     Comment: Performed at [facility], [address] [place], [place] [address]  Basic metabolic panel     Status: Abnormal   Collection Time: 04/07/18  5:02 AM  Result Value Ref Range   Sodium 138 135 - 145 mmol/L   Potassium 4.3 3.5 - 5.1 mmol/L   Chloride 106 98 - 111 mmol/L   CO2 23 22 - 32 mmol/L   Glucose, Bld 186 (H) 70 - 99 mg/dL   BUN 43 (H) 8 - 23 mg/dL   Creatinine, Ser 1.50 (H) 0.61 - 1.24 mg/dL   Calcium 7.7 (L) 8.9 - 10.3 mg/dL   GFR calc non Af Amer 40 (L) >60 mL/min   GFR calc Af Amer 46 (L) >60 mL/min    Comment: (NOTE) The eGFR has been calculated using the CKD EPI equation. This calculation has not been validated in all clinical situations. eGFR's persistently <60 mL/min signify possible Chronic Kidney Disease.    Anion gap 9 5 - 15    Comment: Performed at [facility], [address] [place], [place] [address]  Glucose, capillary     Status: Abnormal   Collection Time: 04/07/18  7:26 AM  Result Value Ref Range   Glucose-Capillary 165 (H) 70 - 99 mg/dL  Glucose, capillary     Status: Abnormal   Collection Time: 04/07/18 11:59 AM  Result Value Ref Range   Glucose-Capillary 202 (H) 70 - 99 mg/dL  Glucose, capillary     Status: Abnormal   Collection Time: 04/07/18  4:34 PM  Result Value Ref Range   Glucose-Capillary 190 (H) 70 - 99 mg/dL  Glucose, capillary     Status: Abnormal   Collection Time: 04/07/18  7:49 PM  Result Value Ref Range   Glucose-Capillary 189 (H) 70 - 99 mg/dL  CBC     Status: Abnormal   Collection Time: 04/08/18  6:46 AM  Result Value Ref Range   WBC 13.0 (H) 3.8 - 10.6 K/uL   RBC 3.53 (L) 4.40 - 5.90 MIL/uL   Hemoglobin 11.4 (L) 13.0 - 18.0 g/dL   HCT 33.4 (L) 40.0 - 52.0 %   MCV 94.7 80.0 - 100.0 fL   MCH 32.3 26.0 - 34.0 pg   MCHC 34.1 32.0 - 36.0 g/dL   RDW 13.3 11.5 - 14.5 %   Platelets 213 150 - 440 K/uL    Comment: Performed at [facility], [address], [place], [place] [address]   Basic metabolic panel     Status: Abnormal   Collection Time: 04/08/18  6:46 AM  Result Value Ref Range   Sodium 137 135 - 145 mmol/L   Potassium 4.7 3.5 - 5.1 mmol/L   Chloride 106 98 - 111 mmol/L   CO2 25 22 - 32 mmol/L  Glucose, Bld 201 (H) 70 - 99 mg/dL   BUN 42 (H) 8 - 23 mg/dL   Creatinine, Ser 1.49 (H) 0.61 - 1.24 mg/dL   Calcium 8.1 (L) 8.9 - 10.3 mg/dL   GFR calc non Af Amer 40 (L) >60 mL/min   GFR calc Af Amer 46 (L) >60 mL/min    Comment: (NOTE) The eGFR has been calculated using the CKD EPI equation. This calculation has not been validated in all clinical situations. eGFR's persistently <60 mL/min signify possible Chronic Kidney Disease.    Anion gap 6 5 - 15    Comment: Performed at [facility], [address] [place], [place] [address]  Glucose, capillary     Status: Abnormal   Collection Time: 04/08/18  7:35 AM  Result Value Ref Range   Glucose-Capillary 174 (H) 70 - 99 mg/dL  Blood gas, arterial     Status: Abnormal   Collection Time: 04/08/18 10:37 AM  Result Value Ref Range   FIO2 0.36    Delivery systems NASAL CANNULA    pH, Arterial 7.22 (L) 7.350 - 7.450   pCO2 arterial 58 (H) 32.0 - 48.0 mmHg   pO2, Arterial 65 (L) 83.0 - 108.0 mmHg   Bicarbonate 23.7 20.0 - 28.0 mmol/L   Acid-base deficit 4.6 (H) 0.0 - 2.0 mmol/L   O2 Saturation 87.6 %   Patient temperature 37.0    Collection site LEFT BRACHIAL    Sample type ARTERIAL DRAW    Allens test (pass/fail) PASS PASS    Comment: Performed at [facility], [address] [place], [place] [address]    No results found.  ROS  Patient is confused, unable to obtain accurate review of systems  Blood pressure (!) 148/85, pulse (!) 127, temperature 98.1 F (36.7 C), temperature source Oral, resp. rate (!) 24, height 6' (1.829 m), weight 79.4 kg, SpO2 96 %. Physical Exam Vital signs: Please see the above listed vital signs HEENT: Trachea is midline, increased respiratory rate with  mild accessory muscle utilization, no oral lesions appreciated, no jugular venous distention noted Cardiovascular: Tachycardia, irregularly irregular Pulmonary: Diffuse expiratory wheezing appreciated with coarse rhonchi noted Abdominal: Positive bowel sounds, soft exam Extremities: No clubbing, cyanosis or edema noted Neurologic: Patient is confused but moves all extremities  Assessment/Plan:  Acute respiratory distress.  Patient with hypercapnic respiratory failure.  Underlying history of COPD with bronchospasm on exam.  Chest x-ray reveals blunting of the left costophrenic angle, most likely combination fluid and consolidation/airspace disease/pneumonia.  Agree with albuterol, Atrovent, Solu-Medrol, continue broad-spectrum anabiotic coverage, patient may need BiPAP, may need Precedex to tolerate  Atrial fibrillation.  Twelve-lead EKG obtained, will check cardiac enzymes, slow ventricular response with most likely Cardizem  Urinary tract infection.  Patient has grown Citrobacter  Leukocytosis most likely secondary to both urinary tract infection and pneumonia  Renal insufficiency.  Most recent BUN and creatinine is 42/1.49 which has [place] currently improved since admission  Critical care time 35 minutes  Paula Zietz 04/08/2018, 11:13 AM

## 2018-04-08 NOTE — Care Management (Signed)
Transferred to icu stepdown today due to respiratory issues. Currently on nasal cannula. PT will require a new order to provide services. Left note for attending to reorder when medically stable

## 2018-04-08 NOTE — Progress Notes (Signed)
[employer] - [place] at [facility]      PATIENT NAME: Fred Watson    MR#:  [id number]  DATE OF BIRTH:  [birth year]  SUBJECTIVE:   Patient was having some respiratory distress this morning and wheezing and ABG showed hypercapnic respiratory failure and patient transferred to stepdown level of care.  Patient also noted to be in atrial fibrillation with rapid ventricular response.  REVIEW OF SYSTEMS:    Review of Systems  Constitutional: Negative for chills, fever and malaise/fatigue.  HENT: Negative for congestion and tinnitus.   Eyes: Negative for blurred vision and double vision.  Respiratory: Positive for shortness of breath and wheezing. Negative for cough.   Cardiovascular: Negative for chest pain, orthopnea and PND.  Gastrointestinal: Negative for abdominal pain, diarrhea, nausea and vomiting.  Genitourinary: Negative for dysuria and hematuria.  Neurological: Positive for weakness (generalized. ). Negative for dizziness, sensory change and focal weakness.  All other systems reviewed and are negative.   Nutrition: Heart Healthy/Carb modified Tolerating Diet: Yes Tolerating PT: Await Eval.    DRUG ALLERGIES:   Allergies  Allergen Reactions  . Ezetimibe Other (See Comments)  . Niacin Other (See Comments)    Flushing  . Ace Inhibitors Cough    Tolerating lisinopril 20 mg without side effects 02/01/2015  . Atorvastatin Other (See Comments)    Nightmares  . Losartan Other (See Comments)    VITALS:  Blood pressure 126/84, pulse (!) 110, temperature (!) 97.5 F (36.4 C), temperature source Axillary, resp. rate (!) 26, height 6' (1.829 m), weight 81.7 kg, SpO2 99 %.  PHYSICAL EXAMINATION:   Physical Exam  GENERAL:  84 y.o.-year-old patient lying in bed very hard of hearing and in mild Resp. Distress.  EYES: Pupils equal, round, reactive to light and accommodation. No scleral icterus. Extraocular muscles intact.  HEENT: Head atraumatic, normocephalic.  Oropharynx and nasopharynx clear.  NECK:  Supple, no jugular venous distention. No thyroid enlargement, no tenderness.  LUNGS: Good air entry bilaterally, diffuse end expiratory wheezing b/l, no rales, rhonchi. negative use of accessory muscles. CARDIOVASCULAR: S1, S2 Irregular. No murmurs, rubs, or gallops.  ABDOMEN: Soft, nontender, nondistended. Bowel sounds present. No organomegaly or mass.  EXTREMITIES: No cyanosis, clubbing or edema b/l.    NEUROLOGIC: Cranial nerves II through XII are intact. No focal Motor or sensory deficits b/l. Globally weak.    PSYCHIATRIC: The patient is alert and oriented x 1.  SKIN: No obvious rash, lesion, or ulcer.    LABORATORY PANEL:   CBC Recent Labs  Lab 04/08/18 0646  WBC 13.0*  HGB 11.4*  HCT 33.4*  PLT 213   ------------------------------------------------------------------------------------------------------------------  Chemistries  Recent Labs  Lab 04/04/2018 1716  04/08/18 0646  NA 137   < > 137  K 5.4*   < > 4.7  CL 103   < > 106  CO2 24   < > 25  GLUCOSE 173*   < > 201*  BUN 53*   < > 42*  CREATININE 2.47*   < > 1.49*  CALCIUM 8.1*   < > 8.1*  AST 26  --   --   ALT 20  --   --   ALKPHOS 88  --   --   BILITOT 0.8  --   --    < > = values in this interval not displayed.   ------------------------------------------------------------------------------------------------------------------  Cardiac Enzymes Recent Labs  Lab 04/18/2018 1716  TROPONINI <0.03   ------------------------------------------------------------------------------------------------------------------  RADIOLOGY:  Dg Chest  Port 1 View  Result Date: 04/08/2018 CLINICAL DATA:  Shortness of breath, increase E L2 level EXAM: PORTABLE CHEST 1 VIEW COMPARISON:  04/20/2018 FINDINGS: There is bilateral interstitial and alveolar airspace opacities. There is a small bilateral pleural effusion, left greater than right. There is left basilar airspace disease. There is  no pneumothorax. There is mild stable cardiomegaly. There is evidence of prior CABG. The osseous structures are unremarkable. IMPRESSION: 1. Mild cardiomegaly with bilateral interstitial and alveolar airspace opacities. Small bilateral pleural effusion and left basilar airspace disease. Differential considerations include pulmonary edema versus multilobar pneumonia. Electronically Signed   By: Elige Ko   On: 04/08/2018 11:15     ASSESSMENT AND PLAN:   84 year old male with past medical history of hypertension, hyperlipidemia, hearing loss, diabetes, COPD, osteoarthritis, macular degeneration who presented to the hospital due to relative hypotension and ruled in for sepsis.  1.  Acute respiratory failure with hypoxia/Hypercapnia-secondary to CHF. - Patient developed worsening respiratory distress this morning.  Noted to have significant wheezing.  Chest x-ray showed bilateral interstitial opacities consistent with pulmonary edema.  Patient transferred to stepdown level of care and placed on BiPAP. -IV Lasix has been initiated.  Wean off oxygen and BiPAP as tolerated.  2.  CHF- acute on chronic diastolic dysfunction. -Secondary to patient being in atrial fibrillation with rapid ventricular response. - Started on IV Cardizem drip for A. fib.  Continue IV Lasix, follow I's and O's and daily weights. - Continue Toprol, await cardiology input, await echocardiogram results.  3.  Sepsis-patient ruled in given his leukocytosis, elevated lactic acid, hypotension. - Source of sepsis is suspected to be a combination of UTI and pneumonia. - cont. IV cefepime.  - urine cultures are positive for Citrobacter Koseri  And it's sensitive to it.  Blood cultures are consistent with contamination.  4.  Urinary tract infection-based off a urinalysis on admission. -Continue IV cefepime, urine cultures are + Citrobacter Koseri.    5.  Acute on chronic renal failure-secondary to ATN from hypotension and  sepsis. - Baseline creatinine is around 1.4-1.7 and Cr. Back to baseline now.   6. A. Fib w/ RVR - due to sepsis/UTI.  Cont. Toprol transferred to [facility] and started on Cardizem gtt.  - Await Cardiology input, Echo results.   7.  Essential hypertension- cont. Toprol as hypotension has resolved.   8.  BPH-no urinary retention.  Continue finasteride.  9.  Hyperlipidemia-continue Pravachol.  10.  Diabetic neuropathy-continue gabapentin.  11.  Diabetes type 2 without complication-continue glimepiride. - cont. SSI.    All the records are reviewed and case discussed with Care Management/Social Worker. Management plans discussed with the patient, family and they are in agreement.  CODE STATUS: Full code  DVT Prophylaxis: Hep SQ  TOTAL critical CAre TIME TAKING CARE OF THIS PATIENT: 30 minutes.   POSSIBLE D/C IN 1-2 DAYS, DEPENDING ON CLINICAL CONDITION.   Houston Siren M.D on 04/08/2018 at 4:20 PM  Between 7am to 6pm - Pager - [phone]  After 6pm go to [email or web address] - Scientist, research (life sciences) [facility]  Office  [phone]  CC: Primary care physician; Mickey Farber, MD

## 2018-04-08 NOTE — Progress Notes (Signed)
PT Cancellation Note  Patient Details Name: Fred Watson MRN: [id number] DOB: 12-05-1926   Cancelled Treatment:    Reason Eval/Treat Not Completed: Medical issues which prohibited therapy(Per chart review, patient noted with transfer to CCU due to decline in respiratory status. Per policy, will require new orders to resume PT services.  Please re-consult as medically appropriate.)   Ulla Mckiernan H. Manson Passey, PT, DPT, NCS 04/08/18, 1:50 PM [phone]

## 2018-04-09 LAB — CBC
HEMATOCRIT: 34.5 % — AB (ref 39.0–52.0)
Hemoglobin: 11.3 g/dL — ABNORMAL LOW (ref 13.0–17.0)
MCH: 31.5 pg (ref 26.0–34.0)
MCHC: 32.8 g/dL (ref 30.0–36.0)
MCV: 96.1 fL (ref 80.0–100.0)
PLATELETS: 237 10*3/uL (ref 150–400)
RBC: 3.59 MIL/uL — ABNORMAL LOW (ref 4.22–5.81)
RDW: 12.7 % (ref 11.5–15.5)
WBC: 7.7 10*3/uL (ref 4.0–10.5)
nRBC: 0 % (ref 0.0–0.2)

## 2018-04-09 LAB — BASIC METABOLIC PANEL
Anion gap: 9 (ref 5–15)
BUN: 54 mg/dL — ABNORMAL HIGH (ref 8–23)
CALCIUM: 8.3 mg/dL — AB (ref 8.9–10.3)
CO2: 23 mmol/L (ref 22–32)
Chloride: 105 mmol/L (ref 98–111)
Creatinine, Ser: 1.89 mg/dL — ABNORMAL HIGH (ref 0.61–1.24)
GFR calc Af Amer: 35 mL/min — ABNORMAL LOW (ref >60)
GFR calc non Af Amer: 30 mL/min — ABNORMAL LOW (ref >60)
GLUCOSE: 254 mg/dL — AB (ref 70–99)
Potassium: 4.5 mmol/L (ref 3.5–5.1)
Sodium: 137 mmol/L (ref 135–145)

## 2018-04-09 LAB — GLUCOSE, CAPILLARY
GLUCOSE-CAPILLARY: 387 mg/dL — AB (ref 70–99)
Glucose-Capillary: 231 mg/dL — ABNORMAL HIGH (ref 70–99)
Glucose-Capillary: 258 mg/dL — ABNORMAL HIGH (ref 70–99)
Glucose-Capillary: 125 mg/dL — ABNORMAL HIGH (ref 70–99)

## 2018-04-09 LAB — ECHOCARDIOGRAM COMPLETE
HEIGHTINCHES: 72 in
WEIGHTICAEL: 2881.85 [oz_av]

## 2018-04-09 MED ORDER — DILTIAZEM HCL 30 MG PO TABS
60.0000 mg | ORAL_TABLET | Freq: Three times a day (TID) | ORAL | Status: DC
  Administered 2018-04-09 – 2018-04-11 (×7): 60 mg via ORAL
  Filled 2018-04-09: qty 1
  Filled 2018-04-09 (×2): qty 2
  Filled 2018-04-09: qty 1
  Filled 2018-04-09 (×3): qty 2

## 2018-04-09 MED ORDER — INSULIN ASPART 100 UNIT/ML ~~LOC~~ SOLN
0.0000 [IU] | Freq: Three times a day (TID) | SUBCUTANEOUS | Status: DC
  Administered 2018-04-09: 11 [IU] via SUBCUTANEOUS
  Administered 2018-04-09: 20 [IU] via SUBCUTANEOUS
  Administered 2018-04-10: 15 [IU] via SUBCUTANEOUS
  Administered 2018-04-10: 20 [IU] via SUBCUTANEOUS
  Administered 2018-04-11: 4 [IU] via SUBCUTANEOUS
  Administered 2018-04-11: 7 [IU] via SUBCUTANEOUS
  Administered 2018-04-11: 15 [IU] via SUBCUTANEOUS
  Administered 2018-04-12: 4 [IU] via SUBCUTANEOUS
  Administered 2018-04-12 (×2): 7 [IU] via SUBCUTANEOUS
  Administered 2018-04-13 – 2018-04-14 (×4): 4 [IU] via SUBCUTANEOUS
  Filled 2018-04-09 (×13): qty 1

## 2018-04-09 MED ORDER — INSULIN ASPART 100 UNIT/ML ~~LOC~~ SOLN
SUBCUTANEOUS | Status: AC
  Filled 2018-04-09: qty 1

## 2018-04-09 MED ORDER — LORAZEPAM 2 MG/ML IJ SOLN
1.0000 mg | Freq: Once | INTRAMUSCULAR | Status: AC
  Administered 2018-04-09: 1 mg via INTRAVENOUS
  Filled 2018-04-09: qty 1

## 2018-04-09 MED ORDER — SODIUM CHLORIDE 0.9 % IV SOLN
2.0000 g | INTRAVENOUS | Status: DC
  Administered 2018-04-10: 2 g via INTRAVENOUS
  Filled 2018-04-09: qty 2

## 2018-04-09 MED ORDER — INSULIN DETEMIR 100 UNIT/ML ~~LOC~~ SOLN
10.0000 [IU] | SUBCUTANEOUS | Status: DC
  Filled 2018-04-09 (×2): qty 0.1

## 2018-04-09 NOTE — Progress Notes (Signed)
Pharmacy Antibiotic Note  Fred Watson is a 84 y.o. male admitted on 04/15/2018 with sepsis. Patient admitted to ICU with acute respiratory distress on 10/8. Pharmacy has been consulted for cefepime dosing.  Plan: Will decrease cefepime from 2g IV Q12hr to 2 g IV Q24hr.  Height: 6' (182.9 cm) Weight: 180 lb 1.9 oz (81.7 kg) IBW/kg (Calculated) : 77.6  Temp (24hrs), Avg:98.1 F (36.7 C), Min:97.5 F (36.4 C), Max:98.5 F (36.9 C)  Recent Labs  Lab 04/09/2018 1716 05/01/2018 2108 04/06/18 0434 04/07/18 0502 04/08/18 0646 04/09/18 0511  WBC 24.7*  --  21.1* 16.3* 13.0* 7.7  CREATININE 2.47*  --  2.02* 1.50* 1.49* 1.89*  LATICACIDVEN 2.0* 1.4  --   --   --   --     Estimated Creatinine Clearance: 29.1 mL/min (A) (by C-G formula based on SCr of 1.89 mg/dL (H)).    Allergies  Allergen Reactions  . Ezetimibe Other (See Comments)  . Niacin Other (See Comments)    Flushing  . Ace Inhibitors Cough    Tolerating lisinopril 20 mg without side effects 02/01/2015  . Atorvastatin Other (See Comments)    Nightmares  . Losartan Other (See Comments)    Antimicrobials this admission: Azithromycin 10/5 x 1 Vancomycin 10/6 >> 10/6  Cefepime 10/5 >>   Dose adjustments this admission: 10/6 cefepime transitioned to 1g Q24h 10/7 cefepime transitioned to 2g Q24hr  10/8 cefepime transitioned to 2g Q12hr 10/9 cefepime transitioned to 2g Q24hr   Microbiology results: 10/5 BCx: coag negative staphylococcus  10/5 UCx: pansensitive Citrobacter Koseri  10/6 MRSA PCR: negative   Thank you for allowing pharmacy to be a part of this patient's care.   Mauri Reading, PharmD Pharmacy Resident  04/09/2018 11:42 AM

## 2018-04-09 NOTE — Progress Notes (Signed)
[employer] [facility] [place] [facility]      PATIENT NAME: Fred Watson    MR#:  [id number]  DATE OF BIRTH:  Nov 21, 1926  SUBJECTIVE:   Pt transferred to the intensive care unit yesterday due to hypercapnic respiratory failure and also A. fib with RVR.  A. Fibrillation much improved and patient has been weaned off Cardizem drip.  Patient was noted to be in CHF received some pulse doses of IV Lasix and also feels better.  REVIEW OF SYSTEMS:    Review of Systems  Constitutional: Negative for chills, fever and malaise/fatigue.  HENT: Negative for congestion and tinnitus.   Eyes: Negative for blurred vision and double vision.  Respiratory: Negative for cough, shortness of breath and wheezing.   Cardiovascular: Negative for chest pain, orthopnea and PND.  Gastrointestinal: Negative for abdominal pain, diarrhea, nausea and vomiting.  Genitourinary: Negative for dysuria and hematuria.  Neurological: Positive for weakness (generalized. ). Negative for dizziness, sensory change and focal weakness.  All other systems reviewed and are negative.   Nutrition: Heart Healthy/Carb modified Tolerating Diet: Yes Tolerating PT: Eval noted.    DRUG ALLERGIES:   Allergies  Allergen Reactions  . Ezetimibe Other (See Comments)  . Niacin Other (See Comments)    Flushing  . Ace Inhibitors Cough    Tolerating lisinopril 20 mg without side effects 02/01/2015  . Atorvastatin Other (See Comments)    Nightmares  . Losartan Other (See Comments)    VITALS:  Blood pressure 121/73, pulse 81, temperature (!) 97.5 F (36.4 C), temperature source Oral, resp. rate 18, height 6' (1.829 m), weight 81.7 kg, SpO2 98 %.  PHYSICAL EXAMINATION:   Physical Exam  GENERAL:  84 y.o.-year-old patient lying in bed very hard of hearing and in NAD.  EYES: Pupils equal, round, reactive to light and accommodation. No scleral icterus. Extraocular muscles intact.  HEENT: Head atraumatic, normocephalic.  Oropharynx and nasopharynx clear.  NECK:  Supple, no jugular venous distention. No thyroid enlargement, no tenderness.  LUNGS: Good air entry bilaterally, no rales, rhonchi, wheezing.  Negative use of accessory muscles. CARDIOVASCULAR: S1, S2 Irregular. No murmurs, rubs, or gallops.  ABDOMEN: Soft, nontender, nondistended. Bowel sounds present. No organomegaly or mass.  EXTREMITIES: No cyanosis, clubbing or edema b/l.    NEUROLOGIC: Cranial nerves II through XII are intact. No focal Motor or sensory deficits b/l. Globally weak.    PSYCHIATRIC: The patient is alert and oriented x 1.  SKIN: No obvious rash, lesion, or ulcer.    LABORATORY PANEL:   CBC Recent Labs  Lab 04/09/18 0511  WBC 7.7  HGB 11.3*  HCT 34.5*  PLT 237   ------------------------------------------------------------------------------------------------------------------  Chemistries  Recent Labs  Lab 04-14-2018 1716  04/09/18 0511  NA 137   < > 137  K 5.4*   < > 4.5  CL 103   < > 105  CO2 24   < > 23  GLUCOSE 173*   < > 254*  BUN 53*   < > 54*  CREATININE 2.47*   < > 1.89*  CALCIUM 8.1*   < > 8.3*  AST 26  --   --   ALT 20  --   --   ALKPHOS 88  --   --   BILITOT 0.8  --   --    < > = values in this interval not displayed.   ------------------------------------------------------------------------------------------------------------------  Cardiac Enzymes Recent Labs  Lab 04-14-2018 1716  TROPONINI <0.03   ------------------------------------------------------------------------------------------------------------------  RADIOLOGY:  Dg Chest Port 1 View  Result Date: 04/08/2018 CLINICAL DATA:  Shortness of breath, increase E L2 level EXAM: PORTABLE CHEST 1 VIEW COMPARISON:  04/14/2018 FINDINGS: There is bilateral interstitial and alveolar airspace opacities. There is a small bilateral pleural effusion, left greater than right. There is left basilar airspace disease. There is no pneumothorax. There is  mild stable cardiomegaly. There is evidence of prior CABG. The osseous structures are unremarkable. IMPRESSION: 1. Mild cardiomegaly with bilateral interstitial and alveolar airspace opacities. Small bilateral pleural effusion and left basilar airspace disease. Differential considerations include pulmonary edema versus multilobar pneumonia. Electronically Signed   By: Elige Ko   On: 04/08/2018 11:15     ASSESSMENT AND PLAN:   84 year old male with past medical history of hypertension, hyperlipidemia, hearing loss, diabetes, COPD, osteoarthritis, macular degeneration who presented to the hospital due to relative hypotension and ruled in for sepsis.  1.  Acute respiratory failure with hypoxia/Hypercapnia-secondary to CHF. -Patient received some pulse doses of IV Lasix and has responded well.  Never was placed on BiPAP, respiratory status much improved. -Continue Lasix, oral Cardizem, Toprol for rate control for underlying A. fib.   2.  CHF- acute on chronic systolic dysfunction -Patient's echocardiogram from yesterday showing EF of 45 to 50%. - Improved with IV diuresis.  Continue oral Lasix, Toprol.  Patient had some CKD therefore hold off on initiating ACE inhibitor for now. -Follow I's and O's and daily weights.  3.  Sepsis-patient ruled in given his leukocytosis, elevated lactic acid, hypotension. - Source of sepsis is suspected to be a combination of UTI and pneumonia. - cont. IV cefepime.  - urine cultures are positive for Citrobacter Koseri  And it's sensitive to it.  Blood cultures are consistent with contamination.  4.  Urinary tract infection-based off a urinalysis on admission. -Continue IV cefepime, urine cultures are + Citrobacter Koseri.   - can switch to Oral Keflex upon discharge.   5.  Acute on chronic renal failure-secondary to ATN from hypotension and sepsis. - Baseline creatinine is around 1.4-1.7 and Cr. Back to baseline now.   6. A. Fib w/ RVR - due to  sepsis/UTI.   -Was on a Cardizem drip, but has been weaned off of it now.  Continue oral Cardizem, Toprol.  Patient is high risk for long-term anticoagulation given his advanced age and fall risk. - cont. ASA - Echo showing reduced EF of 45-50%.   7.  Essential hypertension- cont. Toprol, Cardizem.   8.  BPH-no urinary retention.  Continue finasteride.  9.  Hyperlipidemia-continue Pravachol.  10.  Diabetic neuropathy-continue gabapentin.  11.  Diabetes type 2 without complication-continue glimepiride. - cont. SSI.    All the records are reviewed and case discussed with Care Management/Social Worker. Management plans discussed with the patient, family and they are in agreement.  CODE STATUS: Full code  DVT Prophylaxis: Hep SQ  TOTAL critical CAre TIME TAKING CARE OF THIS PATIENT: 30 minutes.   POSSIBLE D/C IN 1-2 DAYS, DEPENDING ON CLINICAL CONDITION.   [place] Siren M.D on 04/09/2018 at 5:15 PM  Between 7am to 6pm - Pager - [phone]  After 6pm go to [email or web address] - Scientist, research (life sciences) [facility]  Office  [phone]  CC: Primary care physician; Mickey Farber, MD

## 2018-04-09 NOTE — Progress Notes (Signed)
Inpatient Diabetes Program Recommendations  AACE/ADA: New Consensus Statement on Inpatient Glycemic Control (2015)  Target Ranges:  Prepandial:   less than 140 mg/dL      Peak postprandial:   less than 180 mg/dL (1-2 hours)      Critically ill patients:  140 - 180 mg/dL   Lab Results  Component Value Date   GLUCAP 387 (H) 04/09/2018    Review of Glycemic ControlResults for GARLON, TUGGLE (MRN [id number]) as of 04/09/2018 12:04  Ref. Range 04/08/2018 11:23 04/08/2018 16:22 04/08/2018 21:30 04/09/2018 07:18 04/09/2018 11:50  Glucose-Capillary Latest Ref Range: 70 - 99 mg/dL 557 (H) 322 (H) 025 (H) 231 (H) 387 (H)    Diabetes history: DM2 Outpatient Diabetes medications: Amaryl 2 mg daily Current orders for Inpatient glycemic control:  Novolog sensitive tid with meals, Amaryl 2 mg daily Inpatient Diabetes Program Recommendations:    While on steroids, consider adding Novolog meal coverage 3 units tid with meals (hold if patient eats less than 50%). Also consider adding Levemir 10 units q HS.   Thanks,  Beryl Meager, RN, BC-ADM Inpatient Diabetes Coordinator Pager [phone] (8a-5p)

## 2018-04-09 NOTE — Progress Notes (Signed)
Follow up - Critical Care Medicine Note  Patient Details:    Fred Watson is an 84 y.o. male.Fred Watson is an 84 year old gentleman with a past medical history remarkable for coronary artery disease, ischemic cardiomyopathy with subsequent systolic heart failure, hypertension, hyperlipidemia, macular degeneration, hearing loss, COPD, initially was admitted for generalized weakness, found to be uroseptic also had left sided pneumonia, renal insufficiency and hyperkalemia.  He was initially admitted, started on vancomycin and cefepime, received fluid resuscitation with close follow-up of laboratory and electrolytes him a urinalysis was positive for Citrobacter, which continued on cefepime.  He developed increasing heart rate, felt to be in atrial fibrillation with rapid ventricular response, hypertension, aggressive respiratory insufficiency with elevated PCO2 at 58 and pH 7.22.  Clinically on exam patient is diffusely wheezing confused, with tachycardia.   Lines, Airways, Drains:    Anti-infectives:  Anti-infectives (From admission, onward)   Start     Dose/Rate Route Frequency Ordered Stop   04/09/18 1000  ceFEPIme (MAXIPIME) 2 g in sodium chloride 0.9 % 100 mL IVPB     2 g 200 mL/hr over 30 Minutes Intravenous Every 12 hours 04/08/18 1737     04/07/18 1730  ceFEPIme (MAXIPIME) 2 g in sodium chloride 0.9 % 100 mL IVPB  Status:  Discontinued     2 g 200 mL/hr over 30 Minutes Intravenous Every 24 hours 04/07/18 0950 04/07/18 0950   04/07/18 1730  ceFEPIme (MAXIPIME) 2 g in sodium chloride 0.9 % 100 mL IVPB  Status:  Discontinued     2 g 200 mL/hr over 30 Minutes Intravenous Every 24 hours 04/07/18 0950 04/08/18 1737   04/06/18 1730  ceFEPIme (MAXIPIME) 1 g in sodium chloride 0.9 % 100 mL IVPB  Status:  Discontinued     1 g 200 mL/hr over 30 Minutes Intravenous Every 24 hours 2018/04/29 1817 Apr 29, 2018 2024   04/06/18 1730  ceFEPIme (MAXIPIME) 2 g in sodium chloride 0.9 % 100 mL IVPB  Status:   Discontinued     2 g 200 mL/hr over 30 Minutes Intravenous Every 24 hours 2018-04-29 2024 04/06/18 1409   04/06/18 1730  ceFEPIme (MAXIPIME) 1 g in sodium chloride 0.9 % 100 mL IVPB  Status:  Discontinued     1 g 200 mL/hr over 30 Minutes Intravenous Every 24 hours 04/06/18 1409 04/07/18 0950   04/06/18 1200  vancomycin (VANCOCIN) 1,250 mg in sodium chloride 0.9 % 250 mL IVPB  Status:  Discontinued     1,250 mg 166.7 mL/hr over 90 Minutes Intravenous Every 36 hours 04/06/18 1108 04/07/18 0955   04/06/18 0900  vancomycin (VANCOCIN) IVPB 1000 mg/200 mL premix  Status:  Discontinued     1,000 mg 200 mL/hr over 60 Minutes Intravenous Every 36 hours 04/06/18 0349 04/06/18 0746   04/06/18 0900  vancomycin (VANCOCIN) 1,250 mg in sodium chloride 0.9 % 250 mL IVPB  Status:  Discontinued     1,250 mg 166.7 mL/hr over 90 Minutes Intravenous Every 36 hours 04/06/18 0746 04/06/18 1107   29-Apr-2018 2030  vancomycin (VANCOCIN) IVPB 1000 mg/200 mL premix     1,000 mg 200 mL/hr over 60 Minutes Intravenous  Once 29-Apr-2018 2027 04/06/18 0700   2018-04-29 2029  vancomycin variable dose per unstable renal function (pharmacist dosing)  Status:  Discontinued      Does not apply See admin instructions April 29, 2018 2029 04/07/18 0948   29-Apr-2018 1815  azithromycin (ZITHROMAX) 500 mg in sodium chloride 0.9 % 250 mL IVPB  500 mg 250 mL/hr over 60 Minutes Intravenous  Once 04-17-18 1801 17-Apr-2018 1946   04-17-18 1715  ceFEPIme (MAXIPIME) 2 g in sodium chloride 0.9 % 100 mL IVPB     2 g 200 mL/hr over 30 Minutes Intravenous  Once 04-17-18 1707 2018-04-17 1804      Microbiology: Results for orders placed or performed during the hospital encounter of 2018-04-17  Urine culture     Status: Abnormal   Collection Time: 04-17-2018  5:16 PM  Result Value Ref Range Status   Specimen Description   Final    URINE, RANDOM Performed at [facility], [address], [place], [place] [address]    Special Requests   Final     NONE Performed at [facility], [address], [place], [place] [address]    Culture >=100,000 COLONIES/mL CITROBACTER KOSERI (A)  Final   Report Status 04/08/2018 FINAL  Final   Organism ID, Bacteria CITROBACTER KOSERI (A)  Final      Susceptibility   Citrobacter koseri - MIC*    CEFAZOLIN <=4 SENSITIVE Sensitive     CEFTRIAXONE <=1 SENSITIVE Sensitive     CIPROFLOXACIN <=0.25 SENSITIVE Sensitive     GENTAMICIN <=1 SENSITIVE Sensitive     IMIPENEM <=0.25 SENSITIVE Sensitive     NITROFURANTOIN 32 SENSITIVE Sensitive     TRIMETH/SULFA <=20 SENSITIVE Sensitive     PIP/TAZO <=4 SENSITIVE Sensitive     * >=100,000 COLONIES/mL CITROBACTER KOSERI  Blood Culture (routine x 2)     Status: None (Preliminary result)   Collection Time: 04/17/18  5:17 PM  Result Value Ref Range Status   Specimen Description BLOOD  RT WRIST  Final   Special Requests   Final    BOTTLES DRAWN AEROBIC AND ANAEROBIC Blood Culture adequate volume   Culture   Final    NO GROWTH 4 DAYS Performed at [facility], [address] [place], [place] [address]    Report Status PENDING  Incomplete  Blood Culture (routine x 2)     Status: Abnormal   Collection Time: 04/17/18  5:17 PM  Result Value Ref Range Status   Specimen Description   Final    BLOOD LAC Performed at [facility], [address], [place], [place] [address]    Special Requests   Final    BOTTLES DRAWN AEROBIC AND ANAEROBIC Blood Culture adequate volume Performed at [facility], [address] [place], [place] [address]    Culture  Setup Time   Final    AEROBIC BOTTLE ONLY GRAM POSITIVE COCCI IN CLUSTERS CRITICAL RESULT CALLED TO, READ BACK BY AND VERIFIED WITH: CHRIS NESBITT AT 1540 ON 04/06/18 BY [facility] GRAM STAIN REVIEWED-AGREE WITH RESULT    Culture (A)  Final    STAPHYLOCOCCUS SPECIES (COAGULASE NEGATIVE) THE SIGNIFICANCE OF ISOLATING THIS ORGANISM FROM A SINGLE SET OF BLOOD CULTURES WHEN MULTIPLE SETS  ARE DRAWN IS UNCERTAIN. PLEASE NOTIFY THE MICROBIOLOGY DEPARTMENT WITHIN ONE WEEK IF SPECIATION AND SENSITIVITIES ARE REQUIRED. Performed at [facility], [address], [place], [place] [address]    Report Status 04/08/2018 FINAL  Final  Blood Culture ID Panel (Reflexed)     Status: Abnormal   Collection Time: 2018/04/17  5:17 PM  Result Value Ref Range Status   Enterococcus species NOT DETECTED NOT DETECTED Final   Listeria monocytogenes NOT DETECTED NOT DETECTED Final   Staphylococcus species DETECTED (A) NOT DETECTED Final    Comment: Methicillin (oxacillin) susceptible coagulase  negative staphylococcus. Possible blood culture contaminant (unless isolated from more than one blood culture draw or clinical case suggests pathogenicity). No antibiotic treatment is indicated for blood  culture contaminants. CRITICAL RESULT CALLED TO, READ BACK BY AND VERIFIED WITH: CHRIS NESBITT AT 1540 ON 04/06/18 BY SNJ    Staphylococcus aureus (BCID) NOT DETECTED NOT DETECTED Final   Methicillin resistance NOT DETECTED NOT DETECTED Final   Streptococcus species NOT DETECTED NOT DETECTED Final   Streptococcus agalactiae NOT DETECTED NOT DETECTED Final   Streptococcus pneumoniae NOT DETECTED NOT DETECTED Final   Streptococcus pyogenes NOT DETECTED NOT DETECTED Final   Acinetobacter baumannii NOT DETECTED NOT DETECTED Final   Enterobacteriaceae species NOT DETECTED NOT DETECTED Final   Enterobacter cloacae complex NOT DETECTED NOT DETECTED Final   Escherichia coli NOT DETECTED NOT DETECTED Final   Klebsiella oxytoca NOT DETECTED NOT DETECTED Final   Klebsiella pneumoniae NOT DETECTED NOT DETECTED Final   Proteus species NOT DETECTED NOT DETECTED Final   Serratia marcescens NOT DETECTED NOT DETECTED Final   Haemophilus influenzae NOT DETECTED NOT DETECTED Final   Neisseria meningitidis NOT DETECTED NOT DETECTED Final   Pseudomonas aeruginosa NOT DETECTED NOT DETECTED Final   Candida albicans NOT  DETECTED NOT DETECTED Final   Candida glabrata NOT DETECTED NOT DETECTED Final   Candida krusei NOT DETECTED NOT DETECTED Final   Candida parapsilosis NOT DETECTED NOT DETECTED Final   Candida tropicalis NOT DETECTED NOT DETECTED Final    Comment: Performed at [facility], [address] [place], [place] [address]  MRSA PCR Screening     Status: None   Collection Time: 04/06/18  3:25 PM  Result Value Ref Range Status   MRSA by PCR NEGATIVE NEGATIVE Final    Comment:        The GeneXpert MRSA Assay (FDA approved for NASAL specimens only), is one component of a comprehensive MRSA colonization surveillance program. It is not intended to diagnose MRSA infection nor to guide or monitor treatment for MRSA infections. Performed at [facility], [address] [place], [place] [address]    Studies: Dg Chest [facility] 1 View  Result Date: 04/08/2018 CLINICAL DATA:  Shortness of breath, increase E L2 level EXAM: PORTABLE CHEST 1 VIEW COMPARISON:  04/14/2018 FINDINGS: There is bilateral interstitial and alveolar airspace opacities. There is a small bilateral pleural effusion, left greater than right. There is left basilar airspace disease. There is no pneumothorax. There is mild stable cardiomegaly. There is evidence of prior CABG. The osseous structures are unremarkable. IMPRESSION: 1. Mild cardiomegaly with bilateral interstitial and alveolar airspace opacities. Small bilateral pleural effusion and left basilar airspace disease. Differential considerations include pulmonary edema versus multilobar pneumonia. Electronically Signed   By: Elige Ko   On: 04/08/2018 11:15   Dg Chest Port 1 View  Result Date: 04/09/2018 CLINICAL DATA:  Pt arrived via ems from home with hypotension and AMS. Pt has chronic UTI's, DM, and afib. Pt intia pressure b y home health RN 50/30, pressure increased with fluids to 92/64. Pt lethargic but in no distress. Hx - cardiomyopathy, CHF, COPD,  diabetes, HTN, CABG, non-smoker EXAM: PORTABLE CHEST 1 VIEW COMPARISON:  05/03/2009.  05/02/2009. FINDINGS: Stable changes from prior CABG surgery. Cardiac silhouette is mildly enlarged. No mediastinal or hilar masses. No evidence of adenopathy. There is hazy airspace opacity in the left mid to lower lung. Remainder of the lungs is clear. Pleuroparenchymal scarring noted at the apices is stable. No convincing pleural effusion.  No pneumothorax. Skeletal structures are grossly intact. IMPRESSION: 1. Mild hazy ground-glass type opacity noted in the left mid to lower lung. Patient had similar opacities in this location on the prior exams. This may be chronic, or could reflect acute pneumonia. 2. No other evidence of an acute abnormality.  No pulmonary edema. 3. Status post CABG surgery.  Mild cardiomegaly. Electronically Signed   By: Amie Portland M.D.   On: 04/22/2018 17:38    Consults:    Subjective:    Overnight Issues: Overnight patient did better, atrial fibrillation is under control with IV Cardizem, has had confusion, received 1 dose of Ativan.  Objective:  Vital signs for last 24 hours: Temp:  [97.5 F (36.4 C)-98.5 F (36.9 C)] 97.5 F (36.4 C) (10/09 0800) Pulse Rate:  [68-131] 79 (10/09 0800) Resp:  [16-26] 22 (10/09 0800) BP: (97-153)/(54-91) 124/76 (10/09 0800) SpO2:  [96 %-100 %] 97 % (10/09 0800) Weight:  [81.7 kg] 81.7 kg (10/08 1116)  Hemodynamic parameters for last 24 hours:    Intake/Output from previous day: 10/08 0701 - 10/09 0700 In: 460.6 [P.O.:220; I.V.:140.6; IV Piggyback:100] Out: 400 [Urine:400]  Intake/Output this shift: Total I/O In: 5 [I.V.:5] Out: -   Vent settings for last 24 hours:    Physical Exam:  Vital signs:       Please see the above listed vital signs HEENT:           Trachea is midline, increased respiratory rate with mild accessory muscle utilization, no Fred lesions appreciated, no jugular venous distention noted Cardiovascular:            Tachycardia, irregularly irregular Pulmonary:      Diffuse expiratory wheezing appreciated with coarse rhonchi noted Abdominal:      Positive bowel sounds, soft exam Extremities:     No clubbing, cyanosis or edema noted Neurologic:      Patient is confused but moves all extremities  Assessment/Plan:  Acute respiratory distress.  Patient with hypercapnic respiratory failure.  Underlying history of COPD with bronchospasm on exam.  Chest x-ray reveals blunting of the left costophrenic angle, most likely combination fluid and consolidation/airspace disease/pneumonia.  Agree with albuterol, Atrovent, Solu-Medrol, continue broad-spectrum anabiotic coverage  Atrial fibrillation.    Has been controlled on IV Cardizem, will start Fred Cardizem and wean drip as tolerated  Urinary tract infection.  Patient has grown Citrobacter  Leukocytosis most likely secondary to both urinary tract infection and pneumonia  Renal insufficiency.  Most recent BUN and creatinine is 54/1.89  Hyperglycemia, sliding scale coverage  Gustav Knueppel 04/09/2018  *Care during the described time interval was provided by me and/or other providers on the critical care team.  I have reviewed this patient's available data, including medical history, events of note, physical examination and test results as part of my evaluation.

## 2018-04-09 NOTE — Progress Notes (Signed)
   04/09/18 1300  Clinical Encounter Type  Visited With Patient and family together  Visit Type Initial;Spiritual support  Recommendations Follow-up, as requested.  Spiritual Encounters  Spiritual Needs Emotional;Prayer   Chaplain provided prayer and emotional support for the patient and father. Son is teary, as he watches his father struggle to remember names, relationships, and his past. Chaplain offered active listening and empathetic conversation.

## 2018-04-09 NOTE — Care Management (Signed)
Requested new order for physical therapy.  Patient says he does not have home oxygen.  He is very hard of hearing even with hearing aid in his right ear. Says "they took my other one out downstairs."

## 2018-04-09 NOTE — Progress Notes (Signed)
Report given to [facility] for patient to be transferred to room 251, patients son at bedside, 2 bag of personal belongings and patient is stable on transfer.

## 2018-04-09 NOTE — Progress Notes (Signed)
Physical Therapy Re-Evaluation Patient Details Name: Fred Watson MRN: [id number] DOB: 1926/11/16 Today's Date: 04/09/2018   History of Present Illness  Kyjuan Gause is an 84yo male who comes to [facility] on 10/5 after lethargy, weakness, and inability to stand/AMB. Pt admitted with Sepsis. PMH: recurrent UTI, cardiomyopathy, CHF, COPD, CAD, Dm2, HLD, HTN, Macular Degeneration. Pt transferred to CCU and is being re-evaluated on this date  Clinical Impression  Pt remains very weak during re-evaluation. He requires moderate assist for bed mobility. Pt requires therapist to stabilize walker and assist to come to standing during transfers. Once standing he is very unsteady and continually leans backwards. Therapist must provide assist to prevent him from falling. Unsafe to attempt ambulation at this time. He is able to complete all supine exercises as instructed by therapist with some minimal confusion. He will requires SNF placement at discharge. Pt will benefit from PT services to address deficits in strength, balance, and mobility in order to return to full function at home.      Follow Up Recommendations SNF    Equipment Recommendations  None recommended by PT    Recommendations for Other Services       Precautions / Restrictions Precautions Precautions: Fall Restrictions Weight Bearing Restrictions: No      Mobility  Bed Mobility Overal bed mobility: Needs Assistance Bed Mobility: Supine to Sit     Supine to sit: Mod assist     General bed mobility comments: Cues and assist to get up to EOB and scoot forward so that feet are on floor  Transfers Overall transfer level: Needs assistance Equipment used: Rolling walker (2 wheeled) Transfers: Sit to/from Stand Sit to Stand: Mod assist         General transfer comment: Pt requires therapist to stabilize walker and assist to come to standing. Once standing he is very unsteady and continually leans backwards. Therapist must provide  assist to prevent him from falling. Unsafe to attempt ambulation at this time  Ambulation/Gait                Stairs            Wheelchair Mobility    Modified Rankin (Stroke Patients Only)       Balance Overall balance assessment: Needs assistance Sitting-balance support: Feet supported Sitting balance-Leahy Scale: Good     Standing balance support: Bilateral upper extremity supported Standing balance-Leahy Scale: Poor Standing balance comment: Requires continual min to modA+1 to remain standing due to posterior LOB                             Pertinent Vitals/Pain Pain Assessment: No/denies pain    Home Living Family/patient expects to be discharged to:: Private residence Living Arrangements: Children;Other (Comment)(Son and great grandson) Available Help at Discharge: Family;Available 24 hours/day Type of Home: House Home Access: Ramped entrance     Home Layout: One level Home Equipment: Walker - 4 wheels;Cane - single point;Shower seat - built in;Hand held Careers information officer - 2 wheels      Prior Function Level of Independence: Needs assistance   Gait / Transfers Assistance Needed: Mod I [id number] or cane in home. Family encouragint pt to utilize front-wheeled walker for added stability/safety  ADL's / Homemaking Assistance Needed: Pt able to manage some ADLs but required assistance with showering, cooking, transportation, and house keeping.  Supervision at all times due to decreased cognition and decreased safety awareness.  Comments: Son states  pt has needed more assistance in the last year.     Hand Dominance   Dominant Hand: Right    Extremity/Trunk Assessment   Upper Extremity Assessment Upper Extremity Assessment: Generalized weakness    Lower Extremity Assessment Lower Extremity Assessment: Generalized weakness       Communication   Communication: HOH  Cognition Arousal/Alertness: Awake/alert Behavior During Therapy: WFL  for tasks assessed/performed Overall Cognitive Status: History of cognitive impairments - at baseline                                 General Comments: quite HOH even with hearing devices. Pt is AOx1 but can't recall DOB. Unable to accurately reports his location or the year/month      General Comments      Exercises Total Joint Exercises Ankle Circles/Pumps: Both;10 reps Quad Sets: Both;10 reps Towel Squeeze: Both;10 reps Hip ABduction/ADduction: Both;10 reps Straight Leg Raises: Both;10 reps   Assessment/Plan    PT Assessment Patient needs continued PT services  PT Problem List Cardiopulmonary status limiting activity;Decreased activity tolerance;Decreased strength;Decreased balance       PT Treatment Interventions Stair training;Functional mobility training;Therapeutic activities;Therapeutic exercise;Gait training;Patient/family education;Balance training;Neuromuscular re-education;DME instruction;Cognitive remediation    PT Goals (Current goals can be found in the Care Plan section)  Acute Rehab PT Goals Patient Stated Goal: Improve strength and mobility PT Goal Formulation: With patient/family Time For Goal Achievement: 04/23/18 Potential to Achieve Goals: Fair    Frequency Min 2X/week   Barriers to discharge        Co-evaluation               AM-PAC PT "6 Clicks" Daily Activity  Outcome Measure Difficulty turning over in bed (including adjusting bedclothes, sheets and blankets)?: A Lot Difficulty moving from lying on back to sitting on the side of the bed? : A Lot Difficulty sitting down on and standing up from a chair with arms (e.g., wheelchair, bedside commode, etc,.)?: A Lot Help needed moving to and from a bed to chair (including a wheelchair)?: Total Help needed walking in hospital room?: Total Help needed climbing 3-5 steps with a railing? : Total 6 Click Score: 9    End of Session Equipment Utilized During Treatment: Gait  belt Activity Tolerance: Patient tolerated treatment well Patient left: in bed;with bed alarm set;with call bell/phone within reach;with family/visitor present;with SCD's reapplied   PT Visit Diagnosis: Unsteadiness on feet (R26.81);Difficulty in walking, not elsewhere classified (R26.2);Muscle weakness (generalized) (M62.81)    Time: 1428-1500 PT Time Calculation (min) (ACUTE ONLY): 32 min   Charges:   PT Evaluation $PT Re-evaluation: 1 Re-eval PT Treatments $Therapeutic Exercise: 8-22 mins       Sharalyn Ink Kissy Cielo PT, DPT, GCS   Belisa Eichholz 04/09/2018, 4:44 PM

## 2018-04-10 DIAGNOSIS — E875 Hyperkalemia: Secondary | ICD-10-CM

## 2018-04-10 DIAGNOSIS — I4891 Unspecified atrial fibrillation: Secondary | ICD-10-CM

## 2018-04-10 DIAGNOSIS — I10 Essential (primary) hypertension: Secondary | ICD-10-CM

## 2018-04-10 LAB — HEPARIN LEVEL (UNFRACTIONATED)
HEPARIN UNFRACTIONATED: 0.6 [IU]/mL (ref 0.30–0.70)
Heparin Unfractionated: <0.1 IU/mL — ABNORMAL LOW (ref 0.30–0.70)

## 2018-04-10 LAB — CBC WITH DIFFERENTIAL/PLATELET
Abs Immature Granulocytes: 0.1 10*3/uL — ABNORMAL HIGH (ref 0.00–0.07)
Basophils Absolute: 0 10*3/uL (ref 0.0–0.1)
Basophils Relative: 0 %
EOS PCT: 0 %
Eosinophils Absolute: 0 10*3/uL (ref 0.0–0.5)
HEMATOCRIT: 33.4 % — AB (ref 39.0–52.0)
Hemoglobin: 10.8 g/dL — ABNORMAL LOW (ref 13.0–17.0)
Immature Granulocytes: 1 %
LYMPHS ABS: 0.3 10*3/uL — AB (ref 0.7–4.0)
LYMPHS PCT: 2 %
MCH: 31.6 pg (ref 26.0–34.0)
MCHC: 32.3 g/dL (ref 30.0–36.0)
MCV: 97.7 fL (ref 80.0–100.0)
MONO ABS: 0.3 10*3/uL (ref 0.1–1.0)
MONOS PCT: 2 %
Neutro Abs: 15.1 10*3/uL — ABNORMAL HIGH (ref 1.7–7.7)
Neutrophils Relative %: 95 %
Platelets: 302 10*3/uL (ref 150–400)
RBC: 3.42 MIL/uL — ABNORMAL LOW (ref 4.22–5.81)
RDW: 12.6 % (ref 11.5–15.5)
WBC: 15.8 10*3/uL — AB (ref 4.0–10.5)
nRBC: 0 % (ref 0.0–0.2)

## 2018-04-10 LAB — GLUCOSE, CAPILLARY
GLUCOSE-CAPILLARY: 100 mg/dL — AB (ref 70–99)
GLUCOSE-CAPILLARY: 101 mg/dL — AB (ref 70–99)
Glucose-Capillary: 321 mg/dL — ABNORMAL HIGH (ref 70–99)
Glucose-Capillary: 361 mg/dL — ABNORMAL HIGH (ref 70–99)

## 2018-04-10 LAB — BASIC METABOLIC PANEL
ANION GAP: 14 (ref 5–15)
BUN: 79 mg/dL — AB (ref 8–23)
CO2: 21 mmol/L — ABNORMAL LOW (ref 22–32)
CREATININE: 2.59 mg/dL — AB (ref 0.61–1.24)
Calcium: 8.3 mg/dL — ABNORMAL LOW (ref 8.9–10.3)
Chloride: 100 mmol/L (ref 98–111)
GFR calc non Af Amer: 20 mL/min — ABNORMAL LOW (ref >60)
GFR, EST AFRICAN AMERICAN: 24 mL/min — AB (ref >60)
GLUCOSE: 290 mg/dL — AB (ref 70–99)
Potassium: 4.2 mmol/L (ref 3.5–5.1)
Sodium: 135 mmol/L (ref 135–145)

## 2018-04-10 LAB — PROTIME-INR
INR: 1.06
PROTHROMBIN TIME: 13.7 s (ref 11.4–15.2)

## 2018-04-10 LAB — CULTURE, BLOOD (ROUTINE X 2)
Culture: NO GROWTH
Special Requests: ADEQUATE

## 2018-04-10 LAB — APTT: aPTT: 30 seconds (ref 24–36)

## 2018-04-10 MED ORDER — CEPHALEXIN 250 MG PO CAPS
250.0000 mg | ORAL_CAPSULE | Freq: Three times a day (TID) | ORAL | Status: DC
  Administered 2018-04-11: 250 mg via ORAL
  Filled 2018-04-10 (×3): qty 1

## 2018-04-10 MED ORDER — HEPARIN (PORCINE) IN NACL 100-0.45 UNIT/ML-% IJ SOLN
1200.0000 [IU]/h | INTRAMUSCULAR | Status: DC
  Administered 2018-04-10 – 2018-04-11 (×2): 1200 [IU]/h via INTRAVENOUS
  Filled 2018-04-10 (×2): qty 250

## 2018-04-10 MED ORDER — INSULIN DETEMIR 100 UNIT/ML ~~LOC~~ SOLN
10.0000 [IU] | Freq: Every day | SUBCUTANEOUS | Status: DC
  Administered 2018-04-10 – 2018-04-11 (×2): 10 [IU] via SUBCUTANEOUS
  Filled 2018-04-10 (×2): qty 0.1

## 2018-04-10 MED ORDER — SODIUM CHLORIDE 0.9 % IV SOLN
INTRAVENOUS | Status: DC
  Administered 2018-04-10 – 2018-04-12 (×4): via INTRAVENOUS

## 2018-04-10 MED ORDER — HEPARIN BOLUS VIA INFUSION
2000.0000 [IU] | Freq: Once | INTRAVENOUS | Status: AC
  Administered 2018-04-10: 2000 [IU] via INTRAVENOUS
  Filled 2018-04-10: qty 2000

## 2018-04-10 MED ORDER — METHYLPREDNISOLONE SODIUM SUCC 125 MG IJ SOLR
60.0000 mg | Freq: Two times a day (BID) | INTRAMUSCULAR | Status: DC
  Administered 2018-04-10 – 2018-04-11 (×2): 60 mg via INTRAVENOUS
  Filled 2018-04-10 (×2): qty 2

## 2018-04-10 NOTE — Consult Note (Signed)
ANTICOAGULATION CONSULT NOTE - Initial Consult  Pharmacy Consult for Heparin Indication: atrial fibrillation  Allergies  Allergen Reactions  . Ezetimibe Other (See Comments)  . Niacin Other (See Comments)    Flushing  . Ace Inhibitors Cough    Tolerating lisinopril 20 mg without side effects 02/01/2015  . Atorvastatin Other (See Comments)    Nightmares  . Losartan Other (See Comments)    Patient Measurements: Height: 6' (182.9 cm) Weight: 183 lb 9.6 oz (83.3 kg) IBW/kg (Calculated) : 77.6  Vital Signs: Temp: 98.4 F (36.9 C) (10/10 0742) Temp Source: Oral (10/10 0742) BP: 119/71 (10/10 0742) Pulse Rate: 97 (10/10 0742)  Labs: Recent Labs    04/08/18 0646 04/09/18 0511 04/10/18 0612 04/10/18 0859  HGB 11.4* 11.3* 10.8*  --   HCT 33.4* 34.5* 33.4*  --   PLT 213 237 302  --   APTT  --   --   --  30  LABPROT  --   --   --  13.7  INR  --   --   --  1.06  HEPARINUNFRC  --   --   --  <0.10*  CREATININE 1.49* 1.89* 2.59*  --     Estimated Creatinine Clearance: 21.2 mL/min (A) (by C-G formula based on SCr of 2.59 mg/dL (H)).   Medical History: Past Medical History:  Diagnosis Date  . Arthritis   . Cardiomyopathy (HCC)   . CHF (congestive heart failure) (HCC)   . COPD (chronic obstructive pulmonary disease) (HCC)   . Coronary artery disease   . Depression   . Diabetes mellitus without complication (HCC)   . Hearing loss   . Hyperlipidemia   . Hypertension   . Macular degeneration     Assessment: Pharmacy consulted for heparin dosing/montitoring in 84 yo male with new onset Afib. Patient had been receiving Heparin 5,000u sq every 8 hours.  Last dose 07006 10/10.  Therapy was initiated with bolus of 2000 units x 1 and the infusion at 1200 units/hr  1010 1904 HL 0.60 IU/mL   Goal of Therapy:  Heparin level 0.3-0.7 units/ml Monitor platelets by anticoagulation protocol: Yes   Plan:  First heparin level is therapeutic. We will maintain the drip at 1200  units/hr and check another anti-Xa level in 8 hours. Continue to monitor H&H and platelets  Lowella Bandy, PharmD Clinical Pharmacist 04/10/2018 3:41 PM

## 2018-04-10 NOTE — Consult Note (Signed)
Cardiology Consultation:   Patient ID: Fred Watson MRN: [id number]; DOB: 06-Feb-1927  Admit date: 04/24/18 Date of Consult: 04/10/2018  Primary Care Provider: Mickey Farber, MD Primary Cardiologist:  Primary Electrophysiologist:  None    Patient Profile:   Fred Watson is a 84 y.o. male with a hx of new onset Afib, CAD s/p CABG w SVG-PL1, OM1, D1 and LIMA-LAD (10/97), HTN, HLD, CM, HFrEF (45-50% 04/2018), DM2 with neuropathy, COPD, chronic UTI, OA, SNHL, BPH, macular degeneration, depression previous smoker (quit 1965) who is being seen today for the evaluation of new onset Afib with RVR and anticoagulation at the request of Eugenie Norrie, NP.   History of Present Illness:   Mr. Fred Watson is an 84 yo male with PMH as above and s/p CABG with PTA medications including ASA 81mg ,., lasix 40mg  po qd, lisinopril 20mg  po qd, toprol xl 100mg  po qd, and pravastatin 40mg  po qd. Family history includes father with CAD and MI, age 75 and mother with stroke. Allergies (intolerances) are reportedly to ACE (though currently tolerating), Ezetimibe, Cozaar, Niacin (Side effect flushing reported), Atorvastatin (nightmares), Losartan .  Last seen by a cardiologist in 2015. At that time, 05/2014 echo (copied and pasted below) significant for moderate TR/MR/AR, mild RAE / LAE, severe RVE, and hypercontractile LV - EF 30%.   Patient is unfortunately a poor historian and very hard of hearing d/t SNHL. He was reportedly brought to the ED d/t decreased energy, weakness and hypotension. He stated he was unable to ambulate with his rolling walker at home d/t DOE / weakness. EMS also noted a foul urine smell at home.  10/5 In the ED- He was found to have UTI and pna with associated AKI, hyperkalemia and code sepsis activated. Vitals: BP 118/72, HR 103, RR 20, T98.7, SpP2 92%,  Labs: K 5.4, glucose 173, BUN 53, Cr 2.47, Ca 8.1, albumin 3.4, WBC 24.7, RB 3.93, Hgb 12.4; lactic acid 2.0 UA positive for urinary tract  infection EKG: Afib with ventricular rate 95bpm, prolonged PR CXR: Mild hazy ground-glass type opacity noted in the left mid to lower lung. Patient had similar opacities in this location on the prior exams. This may be chronic, or could reflect acute pneumonia. 2. No other evidence of an acute abnormality.  No pulmonary edema. 3. Status post CABG surgery.  Mild cardiomegaly.  Meds: Started on broad spectrum abx, IVF. ACE held d/t renal function and hyperkalemia. Started on Kayexalate. Started on O2.  On 10/7, patient developed Afib with RVR, likely 2/2 underlying infection and converted to sinus rhythm same day after 1 dose Cardizem. On 10/8, patient BP 180/87, received IV labetalol and again developed Afib with RVR. He was moved to the ICU and stabilized before moving back to telemetry. He has remained on IV Cardizem, and on 10/9, he was transitioned to oral Cardizem. He also received IV lasix as was thought to have acute exacerbation of his HFrEF with latest echo EF 45-50%.   Per patient's son, Fred Watson, the patient falls a lot and has fallen several times in the last month. He stated his father usually hits his elbows and knees, rather than head. He stated his father has never had a GI bleed or dark, tarry stools and has never had blood in his urine. He also reported his father has never had a stroke. Patient reportedly does not live alone but with his brother and uses a walker to ambulate.  Past Medical History:  Diagnosis Date  . Arthritis   .  Cardiomyopathy (HCC)   . CHF (congestive heart failure) (HCC)   . COPD (chronic obstructive pulmonary disease) (HCC)   . Coronary artery disease   . Depression   . Diabetes mellitus without complication (HCC)   . Hearing loss   . Hyperlipidemia   . Hypertension   . Macular degeneration     Past Surgical History:  Procedure Laterality Date  . CATARACT EXTRACTION W/PHACO Left 11/12/2017   Procedure: CATARACT EXTRACTION PHACO AND INTRAOCULAR LENS  PLACEMENT (IOC) LEFT DIABETIC;  Surgeon: Nevada Crane, MD;  Location: [facility];  Service: Ophthalmology;  Laterality: Left;  Diabetic  . CHOLECYSTECTOMY    . CORONARY ARTERY BYPASS GRAFT    . HERNIA REPAIR       Home Medications:  Prior to Admission medications   Medication Sig Start Date End Date Taking? Authorizing Provider  aspirin EC 81 MG tablet Take 81 mg by mouth daily.   Yes [provider]  cholecalciferol (VITAMIN D) 1000 units tablet Take 1,000 Units by mouth daily.   Yes [provider]  docusate sodium (COLACE) 100 MG capsule Take 100 mg by mouth 2 (two) times daily.   Yes [provider]  finasteride (PROSCAR) 5 MG tablet Take 5 mg by mouth daily.   Yes [provider]  FLUoxetine (PROZAC) 10 MG capsule Take 1 capsule by mouth daily. 04/22/2018  Yes [provider]  furosemide (LASIX) 40 MG tablet Take 1 tablet by mouth daily. 22-Apr-2018  Yes [provider]  gabapentin (NEURONTIN) 300 MG capsule Take 300 mg by mouth 2 (two) times daily.   Yes [provider]  glimepiride (AMARYL) 2 MG tablet Take 1 tablet (2 mg total) by mouth daily with breakfast. 02/24/18 04/25/18 Yes Sainani, Rolly Pancake, MD  lisinopril (PRINIVIL,ZESTRIL) 20 MG tablet Take 20 mg by mouth daily.   Yes [provider]  metoprolol succinate (TOPROL-XL) 100 MG 24 hr tablet Take 100 mg by mouth daily. Take with or immediately following a meal.   Yes [provider]  pravastatin (PRAVACHOL) 40 MG tablet Take 40 mg by mouth daily.   Yes [provider]    Inpatient Medications: Scheduled Meds: . aspirin EC  81 mg Oral Daily  . cholecalciferol  1,000 Units Oral Daily  . diltiazem  60 mg Oral Q8H  . docusate sodium  100 mg Oral BID  . finasteride  5 mg Oral Daily  . FLUoxetine  10 mg Oral Daily  . furosemide  40 mg Oral Daily  . gabapentin  300 mg Oral BID  . glimepiride  2 mg Oral Q breakfast  . heparin  5,000  Units Subcutaneous Q8H  . insulin aspart  0-20 Units Subcutaneous TID WC  . insulin detemir  10 Units Subcutaneous Q24H  . ipratropium-albuterol  3 mL Nebulization Q6H  . methylPREDNISolone (SOLU-MEDROL) injection  60 mg Intravenous Q6H  . metoprolol succinate  100 mg Oral Daily  . pravastatin  40 mg Oral Daily   Continuous Infusions: . sodium chloride Stopped (04/07/18 1847)  . ceFEPime (MAXIPIME) IV     PRN Meds: sodium chloride, acetaminophen **OR** acetaminophen, labetalol, ondansetron **OR** ondansetron (ZOFRAN) IV, senna-docusate  Allergies:    Allergies  Allergen Reactions  . Ezetimibe Other (See Comments)  . Niacin Other (See Comments)    Flushing  . Ace Inhibitors Cough    Tolerating lisinopril 20 mg without side effects 02/01/2015  . Atorvastatin Other (See Comments)    Nightmares  . Losartan  Other (See Comments)    Social History:   Social History   Socioeconomic History  . Marital status: Widowed    Spouse name: Not on file  . Number of children: Not on file  . Years of education: Not on file  . Highest education level: Not on file  Occupational History  . Not on file  Social Needs  . Financial resource strain: Patient refused  . Food insecurity:    Worry: Patient refused    Inability: Patient refused  . Transportation needs:    Medical: Patient refused    Non-medical: Patient refused  Tobacco Use  . Smoking status: Former Smoker    Packs/day: 1.00    Last attempt to quit: 04/05/1958    Years since quitting: 60.0  . Smokeless tobacco: Never Used  Substance and Sexual Activity  . Alcohol use: Not on file  . Drug use: Not on file  . Sexual activity: Not on file  Lifestyle  . Physical activity:    Days per week: Patient refused    Minutes per session: Patient refused  . Stress: Patient refused  Relationships  . Social connections:    Talks on phone: Patient refused    Gets together: Patient refused    Attends religious service: Patient refused      Active member of club or organization: Patient refused    Attends meetings of clubs or organizations: Patient refused    Relationship status: Patient refused  . Intimate partner violence:    Fear of current or ex partner: Patient refused    Emotionally abused: Patient refused    Physically abused: Patient refused    Forced sexual activity: Patient refused  Other Topics Concern  . Not on file  Social History Narrative   Son helps with medications daily    Family History:    Family History  Problem Relation Age of Onset  . Stroke Mother   . CAD Father   . Heart attack Father      ROS:  Please see the history of present illness.  All other ROS reviewed and negative.     Physical Exam/Data:   Vitals:   04/09/18 2006 04/10/18 0227 04/10/18 0444 04/10/18 0742  BP:   115/62 119/71  Pulse:   76 97  Resp:   17 20  Temp:   (!) 97.5 F (36.4 C) 98.4 F (36.9 C)  TempSrc:   Oral Oral  SpO2: 95% 96% 94% 98%  Weight:   83.3 kg   Height:        Intake/Output Summary (Last 24 hours) at 04/10/2018 0742 Last data filed at 04/10/2018 0551 Gross per 24 hour  Intake 331.45 ml  Output 175 ml  Net 156.45 ml   Filed Weights   04/08/18 1116 04/09/18 1800 04/10/18 0444  Weight: 81.7 kg 81.7 kg 83.3 kg   Body mass index is 24.9 kg/m.  General:  Well nourished, well developed, in no acute distress. Hard of hearing with bilateral hearing aids. Joined by his son, Fred Watson.  HEENT: normal. Bilateral hearing aids Lymph: no adenopathy Neck: elevated JVD Endocrine:  No thryomegaly Vascular: No carotid bruits  Cardiac: IRIR, systolic murmur heard on exam Lungs:  Bilateral Wheezing, rhonchi and rales throughout lung fields  Abd: soft, nontender, no hepatomegaly  Ext: 1+ pitting edema up to b/l tibia Musculoskeletal:  No deformities, bruises on arms from previous falls Skin: warm and dry  Neuro:  Sensorineural hearing impairment and slight dementia Psych:  Normal affect  EKG: Refer  to HPI Telemetry:  Telemetry was personally reviewed and demonstrates:  Afib with RVR and rates 120s-80s  CV Studies:   Relevant CV Studies: 04/2018 TTE - Left ventricle: Systolic function was mildly reduced. The   estimated ejection fraction was in the range of 45% to 50%. - Aortic valve: There was mild regurgitation. - Left atrium: The atrium was mildly dilated.   05/2014 TTE AORTIC ROOT Size:Normal Dissection:No dissection AORTIC VALVE Leaflets:Tricuspid Morphology:Normal Mobility:Fully mobile LEFT VENTRICLE Size:Normal Anterior:HYPOCONTRACTILE Contraction:MOD GLOBAL DECREASE Lateral:HYPOCONTRACTILE Closest EF:30% (Estimated) Septal:HYPOCONTRACTILE LV Masses:No Masses Apical:HYPOCONTRACTILE ZOX:WRUE LVH Inferior:HYPOCONTRACTILE Posterior:HYPOCONTRACTILE Dias.FxClass:N/A MITRAL VALVE Leaflets:Normal Mobility:Fully mobile Morphology:ANNULAR CALC LEFT ATRIUM Size:MILDLY ENLARGED LA Masses:No masses IA Septum:Normal IAS MAIN PA Size:Normal PULMONIC VALVE Morphology:Normal Mobility:Fully mobile RIGHT VENTRICLE RV Masses:No Masses Size:SEVERELY ENLARGED Free Wall:Normal Contraction:Normal TRICUSPID VALVE Leaflets:Normal Mobility:Fully mobile Morphology:Normal RIGHT ATRIUM Size:MILDLY ENLARGED RA Other:None RA Mass:No masses PERICARDIUM Fluid:No effusion INFERIOR VENACAVA Size:Not seen Not Seen DOPPLER ECHO and OTHER SPECIAL PROCEDURES Aortic:MODERATE AR No AS 144.0 cm/sec peak vel 8.3 mmHg peak grad 4.0 mmHg mean grad 1.5 cm^2 by DOPPLER Mitral:MODERATE MR No MS 99.3 cm/sec peak vel 3.9 mmHg peak grad 1.0 mmHg mean grad 1.2 cm^2 by DOPPLER MV Inflow E Vel=45.9 cm/sec MV Annulus E'Vel=nm* E/E'Ratio=nm* Tricuspid:MODERATE TR No TS 320.0 cm/sec peak TR vel 51.0 mmHg peak RV pressure Pulmonary:MILD PR No PS  INTERPRETATION MODERATE LV SYSTOLIC DYSFUNCTION (See above) NORMAL RIGHT VENTRICULAR SYSTOLIC FUNCTION MODERATE VALVULAR REGURGITATION (See  above) NO VALVULAR STENOSIS    Laboratory Data:  Chemistry Recent Labs  Lab 04/08/18 0646 04/09/18 0511 04/10/18 0612  NA 137 137 135  K 4.7 4.5 4.2  CL 106 105 100  CO2 25 23 21*  GLUCOSE 201* 254* 290*  BUN 42* 54* 79*  CREATININE 1.49* 1.89* 2.59*  CALCIUM 8.1* 8.3* 8.3*  GFRNONAA 40* 30* 20*  GFRAA 46* 35* 24*  ANIONGAP 6 9 14     Recent Labs  Lab 04-07-2018 1716  PROT 6.5  ALBUMIN 3.4*  AST 26  ALT 20  ALKPHOS 88  BILITOT 0.8   Hematology Recent Labs  Lab 04/08/18 0646 04/09/18 0511 04/10/18 0612  WBC 13.0* 7.7 15.8*  RBC 3.53* 3.59* 3.42*  HGB 11.4* 11.3* 10.8*  HCT 33.4* 34.5* 33.4*  MCV 94.7 96.1 97.7  MCH 32.3 31.5 31.6  MCHC 34.1 32.8 32.3  RDW 13.3 12.7 12.6  PLT 213 237 302   Cardiac Enzymes Recent Labs  Lab 04-07-18 1716  TROPONINI <0.03   No results for input(s): TROPIPOC in the last 168 hours.  BNPNo results for input(s): BNP, PROBNP in the last 168 hours.  DDimer No results for input(s): DDIMER in the last 168 hours.  Radiology/Studies:  Dg Chest Port 1 View  Result Date: 04/08/2018 CLINICAL DATA:  Shortness of breath, increase E L2 level EXAM: PORTABLE CHEST 1 VIEW COMPARISON:  07-Apr-2018 FINDINGS: There is bilateral interstitial and alveolar airspace opacities. There is a small bilateral pleural effusion, left greater than right. There is left basilar airspace disease. There is no pneumothorax. There is mild stable cardiomegaly. There is evidence of prior CABG. The osseous structures are unremarkable. IMPRESSION: 1. Mild cardiomegaly with bilateral interstitial and alveolar airspace opacities. Small bilateral pleural effusion and left basilar airspace disease. Differential considerations include pulmonary edema versus multilobar pneumonia. Electronically Signed   By: Elige Ko   On: 04/08/2018 11:15    Assessment and Plan:   New Onset Afib with RVR in the setting of sepsis 2/2 pna  and UTI - Afib thought to be new onset, occurring  on 10/7 in the setting of sepsis - CHA2DS2VASc score of at least 5 (HF, HTN, agex2, DM2) and at risk for stroke, recommendation for long term anticoagulation unless contraindicated. - Recommend checking TSH, A1C in setting of new onset Afib with RVR - Plan for rate control only at this time given patient age and infection. Would not recommend rhythm control at this time.  - Continue ASA 81mg , Toprol 100mg  - On Cardizem 60mg . D/t HFrEF: recommend transition off of cardizem by discharge given reduced EF. Recommend increase Toprol to 200mg  if additional rate control / BP control needed after d/c Cardizem. - Consider start long term oral anticoagulation with Eliquis 2.5mg   will need reduced dosage d/t age and renal function. Given h/o falls as reported by son (see HPI last paragraph), will defer to MD for home anticoagulation plan as patient does not live alone but falls several times monthly despite using a walker for ambulation.  - Long discussion with son, Fred Watson, about his father and Atrial Fibrillation  - Recommend follow-up as echo/ cardiology outpatient appointment. Recheck echo EF in 3-6 months to see if improvement in EF following better rate control.  CAD s/p CABG with h/o HFrEF (EF 45-50%)  - S/p CABG as in HPI. H/o CAD with home medications as in HPI. - 04/2018 Echo as above with EF 45-50%. As above, recommend recheck echo in 3-6 months to see if LVEF improves with ventricular rate control and with improvement in sepsis - Continue to monitor I/O, daily weights  Wt 183.6 lbs. Admitted at 175 lbs. LEE on exam. Continue diuresis while monitoring renal function, electrolytes.  - Continue ASA 81mg , Lasix 40mg , Toprol 100mg  po qd  Hyperkalemia, improving - K 5.5  4.2. Continue to monitor electrolytes in setting of illness, diuresis, renal insufficiency - ACE held in setting of hyperkalemia, renal insufficiency. Continue to hold with plan to restart prior to discharge.  - Consider checking  Mg  HTN - Presented with hypotension in the setting of sepsis. Current BP 119/71 - Currently holding ACE d/t renal function / hyperkalemia. Restart prior to discharge with return of renal function to baseline and resolution of hyperkalemia. - Continue Toprol xL. Consider stopping Cardizem d/t reduced EF and can increase Toprol if require more BP support / HR control.  - Monitor  HLD - LDL unknown - recommend check lipid panel. Goal LDL <70 - Continue medical management with home statin   Acute on Chronic Kidney Disease - Cr 2.59 (baseline Cr around 1.5) - Continue to monitor renal function and renal dose medications, avoid contrast procedures  - Per IM  DM2 with neuropathy - SSI - A1C recheck recommended - Continue home medications. - Per IM   For questions or updates, please contact [facility] Please consult [email or web address] for contact info under     Signed, Lennon Alstrom, PA-C  04/10/2018 7:42 AM

## 2018-04-10 NOTE — Progress Notes (Signed)
[employer] - [place] [facility]      PATIENT NAME: Fred Watson    MR#:  [id number]  DATE OF BIRTH:  12/23/1926  SUBJECTIVE:   Heart rates are stable today, creatinine somewhat worse, patient transferred out of the ICU yesterday.  Started on some gentle IV fluids for his acute kidney injury and Lasix has been stopped.  Patient's son is at bedside and updated him on patient's plan of care   REVIEW OF SYSTEMS:    Review of Systems  Constitutional: Negative for chills, fever and malaise/fatigue.  HENT: Negative for congestion and tinnitus.   Eyes: Negative for blurred vision and double vision.  Respiratory: Negative for cough, shortness of breath and wheezing.   Cardiovascular: Negative for chest pain, orthopnea and PND.  Gastrointestinal: Negative for abdominal pain, diarrhea, nausea and vomiting.  Genitourinary: Negative for dysuria and hematuria.  Neurological: Positive for weakness (generalized. ). Negative for dizziness, sensory change and focal weakness.  All other systems reviewed and are negative.   Nutrition: Heart Healthy/Carb modified Tolerating Diet: Yes Tolerating PT: Eval noted.    DRUG ALLERGIES:   Allergies  Allergen Reactions  . Ezetimibe Other (See Comments)  . Niacin Other (See Comments)    Flushing  . Ace Inhibitors Cough    Tolerating lisinopril 20 mg without side effects 02/01/2015  . Atorvastatin Other (See Comments)    Nightmares  . Losartan Other (See Comments)    VITALS:  Blood pressure 119/71, pulse 97, temperature 98.4 F (36.9 C), temperature source Oral, resp. rate 20, height 6' (1.829 m), weight 83.3 kg, SpO2 98 %.  PHYSICAL EXAMINATION:   Physical Exam  GENERAL:  84 y.o.-year-old patient lying in bed very hard of hearing and in NAD.  EYES: Pupils equal, round, reactive to light and accommodation. No scleral icterus. Extraocular muscles intact.  HEENT: Head atraumatic, normocephalic. Oropharynx and nasopharynx  clear.  NECK:  Supple, no jugular venous distention. No thyroid enlargement, no tenderness.  LUNGS: Good air entry bilaterally, no rales, rhonchi, wheezing.  Negative use of accessory muscles. CARDIOVASCULAR: S1, S2 Irregular. No murmurs, rubs, or gallops.  ABDOMEN: Soft, nontender, nondistended. Bowel sounds present. No organomegaly or mass.  EXTREMITIES: No cyanosis, clubbing or edema b/l.    NEUROLOGIC: Cranial nerves II through XII are intact. No focal Motor or sensory deficits b/l. Globally weak.    PSYCHIATRIC: The patient is alert and oriented x 1.  SKIN: No obvious rash, lesion, or ulcer.    LABORATORY PANEL:   CBC Recent Labs  Lab 04/10/18 0612  WBC 15.8*  HGB 10.8*  HCT 33.4*  PLT 302   ------------------------------------------------------------------------------------------------------------------  Chemistries  Recent Labs  Lab 04/08/2018 1716  04/10/18 0612  NA 137   < > 135  K 5.4*   < > 4.2  CL 103   < > 100  CO2 24   < > 21*  GLUCOSE 173*   < > 290*  BUN 53*   < > 79*  CREATININE 2.47*   < > 2.59*  CALCIUM 8.1*   < > 8.3*  AST 26  --   --   ALT 20  --   --   ALKPHOS 88  --   --   BILITOT 0.8  --   --    < > = values in this interval not displayed.   ------------------------------------------------------------------------------------------------------------------  Cardiac Enzymes Recent Labs  Lab 04/01/2018 1716  TROPONINI <0.03   ------------------------------------------------------------------------------------------------------------------  RADIOLOGY:  No results  found.   ASSESSMENT AND PLAN:   84 year old male with past medical history of hypertension, hyperlipidemia, hearing loss, diabetes, COPD, osteoarthritis, macular degeneration who presented to the hospital due to relative hypotension and ruled in for sepsis.  1.  Acute respiratory failure with hypoxia/Hypercapnia-secondary to CHF. -Patient received some pulse doses of IV Lasix and  has responded well.  Never was placed on BiPAP, respiratory status much improved. - lasix held today due to ARF. Cont. Cardizem, Toprol for rate control.  2.  CHF- acute on chronic systolic dysfunction -Patient's echocardiogram from yesterday showing EF of 45 to 50%. - Improved with IV diuresis and lasix stopped to due to ARF.  - cont. Cardizem, Toprol for rate control.  Patient had some CKD therefore hold off on initiating ACE inhibitor for now.   -Follow I's and O's and daily weights.  3.  Sepsis-patient ruled in given his leukocytosis, elevated lactic acid, hypotension. - Source of sepsis is UTI. - will switch from IV Cefepime to oral Keflex today.  - urine cultures are positive for Citrobacter Koseri  And it's sensitive to it.  Blood cultures are consistent with contamination.  4.  Urinary tract infection-based off a urinalysis on admission. - switch to Oral keflex today from Cefepime, urine cultures are + Citrobacter Koseri.    5.  Acute on chronic renal failure-secondary to ATN from hypotension and sepsis. -Creatinine is a little bit worse secondary to overdiuresis, Lasix held, gentle IV fluids started.  Will follow BUN and creatinine tomorrow.  6. A. Fib w/ RVR - due to sepsis/UTI.   -Was on a Cardizem drip, but has been weaned off of it now.  Continue oral Cardizem, Toprol.  Patient is high risk for long-term anticoagulation given his advanced age and fall risk. Cont. Heparin gtt, ASA for now as per Cardiology.  - Echo showing reduced EF of 45-50%.   7.  Essential hypertension- cont. Toprol, Cardizem.   8.  BPH-no urinary retention.  Continue finasteride.  9.  Hyperlipidemia-continue Pravachol.  10.  Diabetic neuropathy-continue gabapentin.  11.  Diabetes type 2 without complication-BS somewhat uncontrolled due to IV steroids.  - will taper IV steroids off tomorrow.  - cont. levemir, SSI.    Physical therapy consult noted and patient will likely need short-term rehab,  patient's son is in agreement.  Social work is aware.  Bed offers have been made, awaiting insurance authorization.  All the records are reviewed and case discussed with Care Management/Social Worker. Management plans discussed with the patient, family and they are in agreement.  CODE STATUS: Full code  DVT Prophylaxis: Hep SQ  TOTAL critical CAre TIME TAKING CARE OF THIS PATIENT: 30 minutes.   POSSIBLE D/C IN 2-3 DAYS, DEPENDING ON CLINICAL CONDITION.   Houston Siren M.D on 04/10/2018 at 4:15 PM  Between 7am to 6pm - Pager - [phone]  After 6pm go to [email or web address] - Scientist, research (life sciences) [facility]  Office  [phone]  CC: Primary care physician; Mickey Farber, MD

## 2018-04-10 NOTE — Consult Note (Signed)
ANTICOAGULATION CONSULT NOTE - Initial Consult  Pharmacy Consult for Heparin Indication: atrial fibrillation  Allergies  Allergen Reactions  . Ezetimibe Other (See Comments)  . Niacin Other (See Comments)    Flushing  . Ace Inhibitors Cough    Tolerating lisinopril 20 mg without side effects 02/01/2015  . Atorvastatin Other (See Comments)    Nightmares  . Losartan Other (See Comments)    Patient Measurements: Height: 6' (182.9 cm) Weight: 183 lb 9.6 oz (83.3 kg) IBW/kg (Calculated) : 77.6  Vital Signs: Temp: 98.4 F (36.9 C) (10/10 0742) Temp Source: Oral (10/10 0742) BP: 119/71 (10/10 0742) Pulse Rate: 97 (10/10 0742)  Labs: Recent Labs    04/08/18 0646 04/09/18 0511 04/10/18 0612  HGB 11.4* 11.3* 10.8*  HCT 33.4* 34.5* 33.4*  PLT 213 237 302  CREATININE 1.49* 1.89* 2.59*    Estimated Creatinine Clearance: 21.2 mL/min (A) (by C-G formula based on SCr of 2.59 mg/dL (H)).   Medical History: Past Medical History:  Diagnosis Date  . Arthritis   . Cardiomyopathy (HCC)   . CHF (congestive heart failure) (HCC)   . COPD (chronic obstructive pulmonary disease) (HCC)   . Coronary artery disease   . Depression   . Diabetes mellitus without complication (HCC)   . Hearing loss   . Hyperlipidemia   . Hypertension   . Macular degeneration     Assessment: Pharmacy consulted for heparin dosing/montitoring in 84 yo male with new onset Afib  Patient has been receiving Heparin 5,000u sq every 8 hours.  Last dose 07006 10/10.   Goal of Therapy:  Heparin level 0.3-0.7 units/ml Monitor platelets by anticoagulation protocol: Yes   Plan:  Baseline labs ordered Give reduced bolus of 2000 units x 1 since patient received 5000u sq at 0706 10/10 Start heparin infusion at 1200 units/hr Check anti-Xa level in 8 hours and daily while on heparin Continue to monitor H&H and platelets  Albina Billet, PharmD Clinical Pharmacist 04/10/2018 9:00 AM

## 2018-04-10 NOTE — Progress Notes (Signed)
Inpatient Diabetes Program Recommendations  AACE/ADA: New Consensus Statement on Inpatient Glycemic Control (2019)  Target Ranges:  Prepandial:   less than 140 mg/dL      Peak postprandial:   less than 180 mg/dL (1-2 hours)      Critically ill patients:  140 - 180 mg/dL  Results for ADIN, LAKER (MRN [id number]) as of 04/10/2018 10:11  Ref. Range 04/09/2018 07:18 04/09/2018 11:50 04/09/2018 16:01 04/09/2018 21:17 04/10/2018 07:39  Glucose-Capillary Latest Ref Range: 70 - 99 mg/dL 469 (H)  Novolog 3 units  Amaryl 2 mg 387 (H)  Novolog 20 units 258 (H)  Novolog 11 units 125 (H) 321 (H)  Novolog 15 units  Amaryl 2mg     Review of Glycemic Control  Diabetes history: DM2 Outpatient Diabetes medications: Amaryl 2 mg QAM Current orders for Inpatient glycemic control: Levemir 10 units Q24H (18:00), Novolog 0-20 units TID with meals, Amaryl 2 mg QAM; Solumedrol 60 mg Q6H  Inpatient Diabetes Program Recommendations: Insulin - Basal: Noted Levemir 10 units Q24H ordered on 04/09/18 to be given at 18:00. Per chart, Levemir was not charted against at all. Therefore, appears patient did NOT receive Levemir on 04/09/18. As a result fasting glucose 321 mg/dl today. Please reschedule Levemir to 10 units daily (to start now).  Thanks, Orlando Penner, RN, MSN, CDE Diabetes Coordinator Inpatient Diabetes Program [phone] (Team Pager from 8am to 5pm)

## 2018-04-10 NOTE — Plan of Care (Signed)

## 2018-04-10 NOTE — Clinical Social Work Note (Addendum)
CSW met with patient's son Ruxin Ransome, [phone], he was asking about bed offers for patient.  CSW informed patient's son what bed offers are available.  CSW spent time discussing the process for helping patient go to SNF for short term rehab.  CSW explained how insurance will have to approve patient, and then how the social worker at the facility can assist with discharge planning after patient is finished with his rehab.  Patient's son feels like patient will need long term care at [facility] after rehab.  Patient's son went to different SNFs and decided he would like patient to go to [facility].  CSW spoke to [place] they will begin insurance authorization, and can accept patient once insurance approval has been received.  CSW to continue to follow patient's progress throughout discharge planning.  Jones Broom. [place], MSW, [place]  04/10/2018 5:43 PM

## 2018-04-10 NOTE — Plan of Care (Signed)
  Problem: Clinical Measurements: Goal: Respiratory complications will improve Outcome: Progressing   Problem: Clinical Measurements: Goal: Signs and symptoms of infection will decrease Outcome: Progressing   

## 2018-04-10 NOTE — Care Management Important Message (Signed)
Copy of signed IM left with patient in room.  

## 2018-04-11 ENCOUNTER — Inpatient Hospital Stay: Payer: Medicare Other

## 2018-04-11 LAB — BASIC METABOLIC PANEL
Anion gap: 11 (ref 5–15)
BUN: 100 mg/dL — AB (ref 8–23)
CHLORIDE: 102 mmol/L (ref 98–111)
CO2: 21 mmol/L — ABNORMAL LOW (ref 22–32)
CREATININE: 2.89 mg/dL — AB (ref 0.61–1.24)
Calcium: 8.3 mg/dL — ABNORMAL LOW (ref 8.9–10.3)
GFR calc non Af Amer: 18 mL/min — ABNORMAL LOW (ref >60)
GFR, EST AFRICAN AMERICAN: 21 mL/min — AB (ref >60)
Glucose, Bld: 202 mg/dL — ABNORMAL HIGH (ref 70–99)
Potassium: 4.6 mmol/L (ref 3.5–5.1)
Sodium: 134 mmol/L — ABNORMAL LOW (ref 135–145)

## 2018-04-11 LAB — GLUCOSE, CAPILLARY
GLUCOSE-CAPILLARY: 233 mg/dL — AB (ref 70–99)
GLUCOSE-CAPILLARY: 322 mg/dL — AB (ref 70–99)
GLUCOSE-CAPILLARY: 199 mg/dL — AB (ref 70–99)
Glucose-Capillary: 125 mg/dL — ABNORMAL HIGH (ref 70–99)

## 2018-04-11 LAB — BLOOD GAS, ARTERIAL
Acid-base deficit: 6.6 mmol/L — ABNORMAL HIGH (ref 0.0–2.0)
BICARBONATE: 19.1 mmol/L — AB (ref 20.0–28.0)
FIO2: 0.21
O2 SAT: 91 %
PATIENT TEMPERATURE: 37
PCO2 ART: 38 mmHg (ref 32.0–48.0)
PO2 ART: 67 mmHg — AB (ref 83.0–108.0)
pH, Arterial: 7.31 — ABNORMAL LOW (ref 7.350–7.450)

## 2018-04-11 LAB — CBC
HCT: 32.5 % — ABNORMAL LOW (ref 39.0–52.0)
Hemoglobin: 10.7 g/dL — ABNORMAL LOW (ref 13.0–17.0)
MCH: 31.8 pg (ref 26.0–34.0)
MCHC: 32.9 g/dL (ref 30.0–36.0)
MCV: 96.4 fL (ref 80.0–100.0)
NRBC: 0 % (ref 0.0–0.2)
PLATELETS: 327 10*3/uL (ref 150–400)
RBC: 3.37 MIL/uL — ABNORMAL LOW (ref 4.22–5.81)
RDW: 12.8 % (ref 11.5–15.5)
WBC: 17.9 10*3/uL — AB (ref 4.0–10.5)

## 2018-04-11 LAB — HEPARIN LEVEL (UNFRACTIONATED): Heparin Unfractionated: 0.66 IU/mL (ref 0.30–0.70)

## 2018-04-11 MED ORDER — BUDESONIDE 0.5 MG/2ML IN SUSP
0.5000 mg | Freq: Two times a day (BID) | RESPIRATORY_TRACT | Status: DC
  Administered 2018-04-11: 0.5 mg via RESPIRATORY_TRACT
  Filled 2018-04-11: qty 2

## 2018-04-11 MED ORDER — METHYLPREDNISOLONE SODIUM SUCC 125 MG IJ SOLR: 60.0000 mg | Freq: Every day | INTRAMUSCULAR | Status: DC

## 2018-04-11 MED ORDER — DILTIAZEM HCL ER COATED BEADS 180 MG PO CP24
180.0000 mg | ORAL_CAPSULE | Freq: Every day | ORAL | Status: DC
  Administered 2018-04-11 – 2018-04-15 (×5): 180 mg via ORAL
  Filled 2018-04-11 (×5): qty 1

## 2018-04-11 MED ORDER — DILTIAZEM HCL 25 MG/5ML IV SOLN
10.0000 mg | INTRAVENOUS | Status: DC | PRN
  Filled 2018-04-11: qty 5

## 2018-04-11 MED ORDER — SODIUM CHLORIDE 0.9% FLUSH
3.0000 mL | Freq: Two times a day (BID) | INTRAVENOUS | Status: DC
  Administered 2018-04-11 – 2018-04-15 (×8): 3 mL via INTRAVENOUS

## 2018-04-11 MED ORDER — HEPARIN SODIUM (PORCINE) 5000 UNIT/ML IJ SOLN
5000.0000 [IU] | Freq: Three times a day (TID) | INTRAMUSCULAR | Status: DC
  Administered 2018-04-11 – 2018-04-15 (×12): 5000 [IU] via SUBCUTANEOUS
  Filled 2018-04-11 (×12): qty 1

## 2018-04-11 MED ORDER — SODIUM CHLORIDE 0.9 % IV SOLN
3.0000 g | Freq: Two times a day (BID) | INTRAVENOUS | Status: DC
  Administered 2018-04-11 – 2018-04-15 (×9): 3 g via INTRAVENOUS
  Filled 2018-04-11 (×12): qty 3

## 2018-04-11 NOTE — Progress Notes (Signed)
PT Cancellation Note  Patient Details Name: Fred Watson MRN: [id number] DOB: February 27, 1927   Cancelled Treatment:    Reason Eval/Treat Not Completed: Patient at procedure or test/unavailable; pt at swallow study per nursing.  Will attempt to see pt at a future date/time as medically appropriate.     Ovidio Hanger PT, DPT 04/11/18, 1:42 PM

## 2018-04-11 NOTE — Progress Notes (Signed)
   04/11/18 1300  Clinical Encounter Type  Visited With Family  Visit Type Follow-up (AD education.)  Recommendations Follow-up, as requested.  Spiritual Encounters  Spiritual Needs Emotional  Stress Factors  Family Stress Factors Major life changes (Father's diminishing health.)  Advance Directives (For Healthcare)  Does Patient Have a Medical Advance Directive? No (Chaplain has provided education to both sons.)  Would patient like information on creating a medical advance directive?  (Information given to sons, Remo Lipps and Legrand Como.)  Mental Health Advance Directives  Does Patient Have a Mental Health Advance Directive? No   [address] [employer] discussed AD with the patient's sons Remo Lipps and Legrand Como. They are concerned that the patient may not be able to pass screening questions. Chaplain reminded the sons about [place] law concerning HCPOA. Chaplain will follow-up.  [place] met with the patient's son, Legrand Como, concerning completion of the AD. From previous encounters with the patient, in the opinion of Chaplain, the patient is not able to make informed choices about his healthcare. Chaplain informed Legrand Como of this opinion and offered to return to continue the conversation with Remo Lipps, the patient's other son. Legrand Como noted that his father is no longer able competent, thus agreeing with Chaplain.

## 2018-04-11 NOTE — Consult Note (Signed)
ANTICOAGULATION CONSULT NOTE - Initial Consult  Pharmacy Consult for Heparin Indication: atrial fibrillation  Allergies  Allergen Reactions  . Ezetimibe Other (See Comments)  . Niacin Other (See Comments)    Flushing  . Ace Inhibitors Cough    Tolerating lisinopril 20 mg without side effects 02/01/2015  . Atorvastatin Other (See Comments)    Nightmares  . Losartan Other (See Comments)    Patient Measurements: Height: 6' (182.9 cm) Weight: 183 lb 9.6 oz (83.3 kg) IBW/kg (Calculated) : 77.6  Vital Signs: Temp: 97.6 F (36.4 C) (10/10 1945) Temp Source: Oral (10/10 1945) BP: 131/71 (10/10 1945) Pulse Rate: 65 (10/10 1945)  Labs: Recent Labs    04/09/18 0511 04/10/18 0612 04/10/18 0859 04/10/18 1904 04/11/18 0305  HGB 11.3* 10.8*  --   --  10.7*  HCT 34.5* 33.4*  --   --  32.5*  PLT 237 302  --   --  327  APTT  --   --  30  --   --   LABPROT  --   --  13.7  --   --   INR  --   --  1.06  --   --   HEPARINUNFRC  --   --  <0.10* 0.60 0.66  CREATININE 1.89* 2.59*  --   --  2.89*    Estimated Creatinine Clearance: 19 mL/min (A) (by C-G formula based on SCr of 2.89 mg/dL (H)).   Medical History: Past Medical History:  Diagnosis Date  . Arthritis   . Cardiomyopathy (HCC)   . CHF (congestive heart failure) (HCC)   . COPD (chronic obstructive pulmonary disease) (HCC)   . Coronary artery disease   . Depression   . Diabetes mellitus without complication (HCC)   . Hearing loss   . Hyperlipidemia   . Hypertension   . Macular degeneration     Assessment: Pharmacy consulted for heparin dosing/montitoring in 84 yo male with new onset Afib. Patient had been receiving Heparin 5,000u sq every 8 hours.  Last dose 07006 10/10.  Therapy was initiated with bolus of 2000 units x 1 and the infusion at 1200 units/hr  1010 1904 HL 0.60 IU/mL   Goal of Therapy:  Heparin level 0.3-0.7 units/ml Monitor platelets by anticoagulation protocol: Yes   Plan:  10/11 @ 0300 HL 0.66  therapeutic. Will continue current rate and will recheck w/ am labs. CBC stable will continue to monitor.  Thomasene Ripple, PharmD Clinical Pharmacist 04/11/2018 4:46 AM

## 2018-04-11 NOTE — Consult Note (Signed)
Pharmacy Antibiotic Note  Fred Watson is a 84 y.o. male admitted on 04/22/2018 with sepsis secondary to UTI.  Subsequently, pt has been diagnosed with aspiration pneumonia.  Pharmacy has been consulted for unasyn dosing.  Plan: Start Unasyn 3g IV q12hr based on CrCl < 30  Height: 6' (182.9 cm) Weight: 189 lb 4.8 oz (85.9 kg) IBW/kg (Calculated) : 77.6  Temp (24hrs), Avg:98 F (36.7 C), Min:97.6 F (36.4 C), Max:98.4 F (36.9 C)  Recent Labs  Lab 04/22/2018 1716 04/04/2018 2108  04/07/18 0502 04/08/18 0646 04/09/18 0511 04/10/18 0612 04/11/18 0305  WBC 24.7*  --    < > 16.3* 13.0* 7.7 15.8* 17.9*  CREATININE 2.47*  --    < > 1.50* 1.49* 1.89* 2.59* 2.89*  LATICACIDVEN 2.0* 1.4  --   --   --   --   --   --    < > = values in this interval not displayed.    Estimated Creatinine Clearance: 19 mL/min (A) (by C-G formula based on SCr of 2.89 mg/dL (H)).    Allergies  Allergen Reactions  . Ezetimibe Other (See Comments)  . Niacin Other (See Comments)    Flushing  . Ace Inhibitors Cough    Tolerating lisinopril 20 mg without side effects 02/01/2015  . Atorvastatin Other (See Comments)    Nightmares  . Losartan Other (See Comments)    Antimicrobials this admission: 10/05 cefepime >> 10/10 10/11 keflex >> 10/11 10/11 unasyn >>   Microbiology results: 10/05 BCx: coag neg staph 1/4 - contaminate 10/05 UCx: citrobacter > 10,000  10/06 MRSA PCR: NEG  Thank you for allowing pharmacy to be a part of this patient's care.  Albina Billet, PharmD Clinical Pharmacist 04/11/2018 12:28 PM

## 2018-04-11 NOTE — Progress Notes (Signed)
[employer] [facility] [place] at [facility]      PATIENT NAME: Fred Watson    MR#:  [id number]  DATE OF BIRTH:  [birth year]  SUBJECTIVE:   This morning patient is having some shortness of breath, he remains somewhat lethargic and encephalopathic heart rates are stable, creatinine is worse since yesterday.  ABG showing no hypercapnia but chest x-ray this morning suggestive of aspiration.   REVIEW OF SYSTEMS:    Review of Systems  Unable to perform ROS: Mental acuity    Nutrition: NPO Tolerating Diet: No Tolerating PT: Eval noted.    DRUG ALLERGIES:   Allergies  Allergen Reactions  . Ezetimibe Other (See Comments)  . Niacin Other (See Comments)    Flushing  . Ace Inhibitors Cough    Tolerating lisinopril 20 mg without side effects 02/01/2015  . Atorvastatin Other (See Comments)    Nightmares  . Losartan Other (See Comments)    VITALS:  Blood pressure (!) 147/50, pulse 88, temperature 98 F (36.7 C), temperature source Oral, resp. rate (!) 24, height 6' (1.829 m), weight 85.9 kg, SpO2 91 %.  PHYSICAL EXAMINATION:   Physical Exam  GENERAL:  84 y.o.-year-old patient lying in bed very hard of hearing and in Mild Resp. Distress.  EYES: Pupils equal, round, reactive to light. No scleral icterus. Extraocular muscles intact.  HEENT: Head atraumatic, normocephalic. Oropharynx and nasopharynx clear.  NECK:  Supple, no jugular venous distention. No thyroid enlargement, no tenderness.  LUNGS: Good air entry bilaterally, diffuse rhonchi more on the right than the left, no rales, wheezing, positive use of accessory muscles CARDIOVASCULAR: S1, S2 Irregular. No murmurs, rubs, or gallops.  ABDOMEN: Soft, nontender, nondistended. Bowel sounds present. No organomegaly or mass.  EXTREMITIES: No cyanosis, clubbing or edema b/l.    NEUROLOGIC: Cranial nerves II through XII are intact. No focal Motor or sensory deficits b/l. Globally weak.    PSYCHIATRIC: The patient is alert and  oriented x 1.  SKIN: No obvious rash, lesion, or ulcer.    LABORATORY PANEL:   CBC Recent Labs  Lab 04/11/18 0305  WBC 17.9*  HGB 10.7*  HCT 32.5*  PLT 327   ------------------------------------------------------------------------------------------------------------------  Chemistries  Recent Labs  Lab 04/11/2018 1716  04/11/18 0305  NA 137   < > 134*  K 5.4*   < > 4.6  CL 103   < > 102  CO2 24   < > 21*  GLUCOSE 173*   < > 202*  BUN 53*   < > 100*  CREATININE 2.47*   < > 2.89*  CALCIUM 8.1*   < > 8.3*  AST 26  --   --   ALT 20  --   --   ALKPHOS 88  --   --   BILITOT 0.8  --   --    < > = values in this interval not displayed.   ------------------------------------------------------------------------------------------------------------------  Cardiac Enzymes Recent Labs  Lab 04/19/2018 1716  TROPONINI <0.03   ------------------------------------------------------------------------------------------------------------------  RADIOLOGY:  Dg Chest Port 1 View  Result Date: 04/11/2018 CLINICAL DATA:  SOB;   CHF, COPD, diabetes, CAD, EXAM: PORTABLE CHEST 1 VIEW COMPARISON:  04/08/2018 FINDINGS: Median sternotomy and CABG. The heart is enlarged. There is atherosclerotic calcification of the thoracic aorta. There is persistent opacity at the LEFT lung base, consistent atelectasis or infiltrate and pleural effusion. There is increasing opacity at the RIGHT lung base, consistent atelectasis and probable effusion. IMPRESSION: Stable cardiomegaly. Persistent atelectasis or infiltrate  and effusion at the LEFT base. Increasing opacity at the RIGHT base. Electronically Signed   By: Norva Pavlov M.D.   On: 04/11/2018 11:54     ASSESSMENT AND PLAN:   84 year old male with past medical history of hypertension, hyperlipidemia, hearing loss, diabetes, COPD, osteoarthritis, macular degeneration who presented to the hospital due to relative hypotension and ruled in for  sepsis.  1.  Acute respiratory failure with hypoxia/Hypercapnia-secondary to CHF. -Improved with IV diuresis but not creatinine has started to rise.  Off diuretics.  Off BiPAP.  Noted to be in mild respiratory distress today and chest x-ray suggestive of aspiration pneumonia/pneumonitis. -Started on IV Unasyn.  Keep n.p.o. for now. -Continue O2 supplementation.  2.  Aspiration pneumonia/pneumonitis-source of patient's worsening hypoxemia. - Seen by speech and patient is aspirating everything, keep n.p.o. for now.  Started on IV Unasyn.  3.  CHF- acute on chronic systolic dysfunction -Patient's echocardiogram from yesterday showing EF of 45 to 50%. - Improved with IV diuresis and lasix stopped to due to ARF.  - cont. Cardizem, Toprol for rate control.  Patient had some CKD therefore hold off on initiating ACE inhibitor for now.   -Follow I's and O's and daily weights.  4.  Sepsis-patient ruled in given his leukocytosis, elevated lactic acid, hypotension. - Source of sepsis is UTI. Was on IV cefepime and then switched to Keflex but no on Unasyn for aspiration pneumonia.  - urine cultures are positive for Citrobacter Koseri    4.  Urinary tract infection-based off a urinalysis on admission. - was on Cefepime and then switched to Keflex but now aspirating and switched to Unasyn for pneumonia.   5.  Acute on chronic renal failure- initially was from sepsis but improved and then went into CHF and received Lasix and now in ARF again.  - hold Lasix cont. Gentle IV fluids.  Follow BUn/Cr. If not improving consider Nephro consult.   6. A. Fib w/ RVR - due to sepsis/UTI.   - improved and off Cardizem gtt now. Cannot take PO due to aspiration.  - will place on PrN IV Cardizem and monitor.  Cont. ASA. High fall risk and likely should not be on long term anticoagulation.  - Echo showing reduced EF of 45-50%.   7.  Essential hypertension- cont. Toprol, Cardizem.   8.  BPH-no urinary retention.   Continue finasteride.  9.  Hyperlipidemia-continue Pravachol.  10.  Diabetic neuropathy-continue gabapentin.  11.  Hyperglycemia - no hx of DM.  Was due to steroids. Off IV steroids now.  - will d/c levemir and cont. SSI.   Patient's prognosis is quite poor given his dementia, multiple comorbidities as mentioned above.  Discussed with patient's son at bedside and will get palliative care consult for goals of care.  All the records are reviewed and case discussed with Care Management/Social Worker. Management plans discussed with the patient, family and they are in agreement.  CODE STATUS: Full code  DVT Prophylaxis: Hep SQ  TOTAL critical CAre TIME TAKING CARE OF THIS PATIENT: 30 minutes.   POSSIBLE D/C IN 2-3 DAYS, DEPENDING ON CLINICAL CONDITION.   Houston Siren M.D on 04/11/2018 at 2:21 PM  Between 7am to 6pm - Pager - [phone]  After 6pm go to [email or web address] - Scientist, research (life sciences) [facility]  Office  [phone]  CC: Primary care physician; Mickey Farber, MD

## 2018-04-11 NOTE — Progress Notes (Signed)
PT Cancellation Note  Patient Details Name: Fred Watson MRN: [id number] DOB: 18-Sep-1926   Cancelled Treatment:    Reason Eval/Treat Not Completed: Patient scheduled/preparing for procedure, PT held this AM.  Will attempt to see pt at a future date/time as medically appropriate.     Ovidio Hanger PT, DPT 04/11/18, 12:09 PM

## 2018-04-11 NOTE — Progress Notes (Signed)
Progress Note  Patient Name: Fred Watson Date of Encounter: 04/11/2018  Primary Cardiologist: New to [facility] [place]  Subjective   Remains in Afib with ventricular rates in the low 100s to 140s bpm. Renal function continues to decline 1.89-->2.59-->2.89. Potassium 4.6. WBC 15.8-->17.9, HGB 10.8-->10.7. Remains on heparin gtt. Did not sleep much overnight.   Inpatient Medications    Scheduled Meds: . aspirin EC  81 mg Oral Daily  . cephALEXin  250 mg Oral Q8H  . cholecalciferol  1,000 Units Oral Daily  . diltiazem  60 mg Oral Q8H  . docusate sodium  100 mg Oral BID  . finasteride  5 mg Oral Daily  . FLUoxetine  10 mg Oral Daily  . gabapentin  300 mg Oral BID  . insulin aspart  0-20 Units Subcutaneous TID WC  . insulin detemir  10 Units Subcutaneous Daily  . ipratropium-albuterol  3 mL Nebulization Q6H  . [START ON 04/12/2018] methylPREDNISolone (SOLU-MEDROL) injection  60 mg Intravenous Daily  . metoprolol succinate  100 mg Oral Daily  . pravastatin  40 mg Oral Daily   Continuous Infusions: . sodium chloride Stopped (04/10/18 1040)  . sodium chloride 50 mL/hr at 04/11/18 0641  . heparin 1,200 Units/hr (04/11/18 0639)   PRN Meds: sodium chloride, acetaminophen **OR** acetaminophen, labetalol, ondansetron **OR** ondansetron (ZOFRAN) IV, senna-docusate   Vital Signs    Vitals:   04/10/18 1945 04/10/18 2011 04/11/18 0250 04/11/18 0513  BP: 131/71   (!) 127/55  Pulse: 65   89  Resp: 18   20  Temp: 97.6 F (36.4 C)   98.4 F (36.9 C)  TempSrc: Oral   Oral  SpO2: 92% 93% 92% 92%  Weight:    85.9 kg  Height:        Intake/Output Summary (Last 24 hours) at 04/11/2018 0815 Last data filed at 04/11/2018 1610 Gross per 24 hour  Intake 1268.9 ml  Output 300 ml  Net 968.9 ml   Filed Weights   04/09/18 1800 04/10/18 0444 04/11/18 0513  Weight: 81.7 kg 83.3 kg 85.9 kg    Telemetry    Afib with RVR with ventricular rates in the 80s to 120s bpm - Personally  Reviewed  ECG    n/a - Personally Reviewed  Physical Exam   GEN: No acute distress.   Neck: No JVD. Cardiac: Mildly tachycardic, irregularly irregular, II/VI systolic murmur at the base, no rubs, or gallops.  Respiratory: Diminished breath sounds bilaterally.  GI: Soft, nontender, non-distended.   MS: Trace pretibial edema; No deformity. Neuro:  Alert; Nonfocal.  Psych: Normal affect.  Labs    Chemistry Recent Labs  Lab 04/04/2018 1716  04/09/18 0511 04/10/18 0612 04/11/18 0305  NA 137   < > 137 135 134*  K 5.4*   < > 4.5 4.2 4.6  CL 103   < > 105 100 102  CO2 24   < > 23 21* 21*  GLUCOSE 173*   < > 254* 290* 202*  BUN 53*   < > 54* 79* 100*  CREATININE 2.47*   < > 1.89* 2.59* 2.89*  CALCIUM 8.1*   < > 8.3* 8.3* 8.3*  PROT 6.5  --   --   --   --   ALBUMIN 3.4*  --   --   --   --   AST 26  --   --   --   --   ALT 20  --   --   --   --  ALKPHOS 88  --   --   --   --   BILITOT 0.8  --   --   --   --   GFRNONAA 22*   < > 30* 20* 18*  GFRAA 25*   < > 35* 24* 21*  ANIONGAP 10   < > 9 14 11    < > = values in this interval not displayed.     Hematology Recent Labs  Lab 04/09/18 0511 04/10/18 0612 04/11/18 0305  WBC 7.7 15.8* 17.9*  RBC 3.59* 3.42* 3.37*  HGB 11.3* 10.8* 10.7*  HCT 34.5* 33.4* 32.5*  MCV 96.1 97.7 96.4  MCH 31.5 31.6 31.8  MCHC 32.8 32.3 32.9  RDW 12.7 12.6 12.8  PLT 237 302 327    Cardiac Enzymes Recent Labs  Lab 04/26/2018 1716  TROPONINI <0.03   No results for input(s): TROPIPOC in the last 168 hours.   BNPNo results for input(s): BNP, PROBNP in the last 168 hours.   DDimer No results for input(s): DDIMER in the last 168 hours.   Radiology    No results found.  Cardiac Studies   2-D Echo 04/08/2018: Study Conclusions  - Left ventricle: Systolic function was mildly reduced. The   estimated ejection fraction was in the range of 45% to 50%. - Aortic valve: There was mild regurgitation. - Left atrium: The atrium was mildly  dilated.  Patient Profile     84 y.o. 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   Assessment & Plan    1. New onset Afib with RVR: -Remains in Afib with RVR with ventricular rates in the 80s to low 100s with occasional 120s bpm -Consolidate short-acting diltiazem to long acting Cardizem 180 mg daily -Continue Toprol XL 100 mg daily -Reasonable to use Cardizem given his EF is not significantly low and it is felt he may not tolerate further escalation of Toprol  -He has been felt to not be a good candidate for long term, full dose OAC given his frequent falls with associated injury, frail state and CKD -Stop heparin gtt  2. CAD s/p CABG: -Troponin negative x 1, not trended -No chest pain -Not felt to be an ischemic evaluation candidate as below -ASA  3. HFrEF secondary to ICM: -EF 45-50% as above -Given the patient's advanced age, comorbid conditions, and overall frail state, ischemic evaluation has been deferred  -Continue Toprol XL 100 mg daily -Not on ACEi/ARB/spironolactone given his acute on CKD  4. Acute on CKD stage III: -Renal function continues to decline -Likely ATN in the setting of his infection  -Consider nephrology consult, if indicated   5. HTN: -BP 110s to 140s systolic -Toprol and Cardizem as above  6. HLD: -Check lipid and liver -Goal LDL < 70 -PTA pravastatin for now  7. Sepsis secondary to PNA and UTI: -Per IM -Driving #1   For questions or updates, please contact [facility] Please consult [email or web address] for contact info under Cardiology/STEMI.    Signed, Eula Listen, PA-C [facility] Pager: [phone] 04/11/2018, 8:15 AM

## 2018-04-11 NOTE — Clinical Social Work Note (Signed)
CSW received phone call from [facility] and they have received insurance authorization for patient to come to SNF once he is medically ready for discharge and orders have been received.  CSW to continue to follow patient's progress throughout discharge planning.  Ervin Knack. Tasheena Wambolt, MSW, [facility] [phone]  04/11/2018 4:48 PM

## 2018-04-11 NOTE — Plan of Care (Signed)
  Problem: Fluid Volume: Goal: Hemodynamic stability will improve Outcome: Progressing   

## 2018-04-11 NOTE — Evaluation (Signed)
Objective Swallowing Evaluation: Type of Study: MBS-Modified Barium Swallow Study   Patient Details  Name: Fred Watson MRN: [id number] Date of Birth: 1926/08/22  Today's Date: 04/11/2018 Time: SLP Start Time (ACUTE ONLY): 1315 -SLP Stop Time (ACUTE ONLY): 1410  SLP Time Calculation (min) (ACUTE ONLY): 55 min   Past Medical History:  Past Medical History:  Diagnosis Date  . Arthritis   . Cardiomyopathy (HCC)   . CHF (congestive heart failure) (HCC)   . COPD (chronic obstructive pulmonary disease) (HCC)   . Coronary artery disease   . Depression   . Diabetes mellitus without complication (HCC)   . Hearing loss   . Hyperlipidemia   . Hypertension   . Macular degeneration    Past Surgical History:  Past Surgical History:  Procedure Laterality Date  . CATARACT EXTRACTION W/PHACO Left 11/12/2017   Procedure: CATARACT EXTRACTION PHACO AND INTRAOCULAR LENS PLACEMENT (IOC) LEFT DIABETIC;  Surgeon: Nevada Crane, MD;  Location: [facility];  Service: Ophthalmology;  Laterality: Left;  Diabetic  . CHOLECYSTECTOMY    . CORONARY ARTERY BYPASS GRAFT    . HERNIA REPAIR     HPI: Fred Watson is an 84yo male who comes to [facility] on 10[facility] after lethargy, weakness, and inability to stand/AMB. Pt admitted with Sepsis. PMH: recurrent UTI, cardiomyopathy, CHF, COPD, CAD, Dm2, HLD, HTN, Macular Degeneration.  CXR 04/11/2018: Persistent atelectasis or infiltrate and effusion at the LEFT base. Increasing opacity at the RIGHT base.   Subjective: Non-verbal    Assessment / Plan / Recommendation  CHL IP CLINICAL IMPRESSIONS 04/11/2018  Clinical Impression The patient is presenting with profound oropharyngeal dysphagia.  The patient was minimally responsive to external stimuli and was non-verbal.  He was given  teaspoon of barium impregnated applesauce with minimal lingual movement but no posterior transfer or pharyngeal swallow.  There was no response to heightened sensory input  (pressure).  The patient is not responsive to PO at this time.  Recommend NPO until the patient is more aware of his surroundings.  SLP will follow for ongoing assessment for readiness for swallowing.  SLP Visit Diagnosis Dysphagia, oropharyngeal phase (R13.12)  Attention and concentration deficit following --  Frontal lobe and executive function deficit following --  Impact on safety and function Severe aspiration risk;Risk for inadequate nutrition/hydration      No flowsheet data found.   Prognosis 04/11/2018  Prognosis for Safe Diet Advancement Guarded  Barriers to Reach Goals Cognitive deficits;Severity of deficits  Barriers/Prognosis Comment --    CHL IP DIET RECOMMENDATION 04/11/2018  SLP Diet Recommendations NPO  Liquid Administration via --  Medication Administration --  Compensations --  Postural Changes --      CHL IP OTHER RECOMMENDATIONS 04/11/2018  Recommended Consults --  Oral Care Recommendations Oral care QID  Other Recommendations --      No flowsheet data found.    CHL IP FREQUENCY AND DURATION 04/11/2018  Speech Therapy Frequency (ACUTE ONLY) min 3x week  Treatment Duration 2 weeks           CHL IP ORAL PHASE 04/11/2018  Oral Phase Impaired  Oral - Pudding Teaspoon Other (Comment)  Oral - Pudding Cup --  Oral - Honey Teaspoon --  Oral - Honey Cup --  Oral - Nectar Teaspoon --  Oral - Nectar Cup --  Oral - Nectar Straw --  Oral - Thin Teaspoon --  Oral - Thin Cup --  Oral - Thin Straw --  Oral - Puree --  Oral - Mech Soft --  Oral - Regular --  Oral - Multi-Consistency --  Oral - Pill --  Oral Phase - Comment --    CHL IP PHARYNGEAL PHASE 04/11/2018  Pharyngeal Phase Impaired  Pharyngeal- Pudding Teaspoon Other (Comment)  Pharyngeal --  Pharyngeal- Pudding Cup --  Pharyngeal --  Pharyngeal- Honey Teaspoon --  Pharyngeal --  Pharyngeal- Honey Cup --  Pharyngeal --  Pharyngeal- Nectar Teaspoon --  Pharyngeal --  Pharyngeal- Nectar  Cup --  Pharyngeal --  Pharyngeal- Nectar Straw --  Pharyngeal --  Pharyngeal- Thin Teaspoon Other (Comment)  Pharyngeal --  Pharyngeal- Thin Cup --  Pharyngeal --  Pharyngeal- Thin Straw --  Pharyngeal --  Pharyngeal- Puree --  Pharyngeal --  Pharyngeal- Mechanical Soft --  Pharyngeal --  Pharyngeal- Regular --  Pharyngeal --  Pharyngeal- Multi-consistency --  Pharyngeal --  Pharyngeal- Pill --  Pharyngeal --  Pharyngeal Comment --     No flowsheet data found.  Dollene Primrose, MS/CCC- SLP  Leandrew Koyanagi 04/11/2018, 2:11 PM

## 2018-04-12 ENCOUNTER — Inpatient Hospital Stay: Payer: Medicare Other

## 2018-04-12 DIAGNOSIS — N189 Chronic kidney disease, unspecified: Secondary | ICD-10-CM

## 2018-04-12 DIAGNOSIS — Z992 Dependence on renal dialysis: Secondary | ICD-10-CM

## 2018-04-12 DIAGNOSIS — I255 Ischemic cardiomyopathy: Secondary | ICD-10-CM

## 2018-04-12 DIAGNOSIS — I2510 Atherosclerotic heart disease of native coronary artery without angina pectoris: Secondary | ICD-10-CM

## 2018-04-12 LAB — CBC
HCT: 32.7 % — ABNORMAL LOW (ref 39.0–52.0)
HEMOGLOBIN: 11.1 g/dL — AB (ref 13.0–17.0)
MCH: 32.1 pg (ref 26.0–34.0)
MCHC: 33.9 g/dL (ref 30.0–36.0)
MCV: 94.5 fL (ref 80.0–100.0)
NRBC: 0 % (ref 0.0–0.2)
PLATELETS: 214 10*3/uL (ref 150–400)
RBC: 3.46 MIL/uL — AB (ref 4.22–5.81)
RDW: 13.1 % (ref 11.5–15.5)
WBC: 14.4 10*3/uL — AB (ref 4.0–10.5)

## 2018-04-12 LAB — BASIC METABOLIC PANEL
ANION GAP: 14 (ref 5–15)
Anion gap: 9 (ref 5–15)
BUN: 105 mg/dL — ABNORMAL HIGH (ref 8–23)
BUN: 105 mg/dL — AB (ref 8–23)
CALCIUM: 8.1 mg/dL — AB (ref 8.9–10.3)
CHLORIDE: 103 mmol/L (ref 98–111)
CO2: 18 mmol/L — ABNORMAL LOW (ref 22–32)
CO2: 23 mmol/L (ref 22–32)
Calcium: 8.1 mg/dL — ABNORMAL LOW (ref 8.9–10.3)
Chloride: 105 mmol/L (ref 98–111)
Creatinine, Ser: 3.47 mg/dL — ABNORMAL HIGH (ref 0.61–1.24)
Creatinine, Ser: 3.09 mg/dL — ABNORMAL HIGH (ref 0.61–1.24)
GFR calc non Af Amer: 16 mL/min — ABNORMAL LOW (ref >60)
GFR, EST AFRICAN AMERICAN: 17 mL/min — AB (ref >60)
GFR, EST AFRICAN AMERICAN: 19 mL/min — AB (ref >60)
GFR, EST NON AFRICAN AMERICAN: 14 mL/min — AB (ref >60)
Glucose, Bld: 196 mg/dL — ABNORMAL HIGH (ref 70–99)
Glucose, Bld: 233 mg/dL — ABNORMAL HIGH (ref 70–99)
POTASSIUM: 5.7 mmol/L — AB (ref 3.5–5.1)
Potassium: 4.4 mmol/L (ref 3.5–5.1)
SODIUM: 135 mmol/L (ref 135–145)
SODIUM: 137 mmol/L (ref 135–145)

## 2018-04-12 LAB — GLUCOSE, CAPILLARY
GLUCOSE-CAPILLARY: 192 mg/dL — AB (ref 70–99)
GLUCOSE-CAPILLARY: 217 mg/dL — AB (ref 70–99)
GLUCOSE-CAPILLARY: 163 mg/dL — AB (ref 70–99)
Glucose-Capillary: 214 mg/dL — ABNORMAL HIGH (ref 70–99)

## 2018-04-12 MED ORDER — BISACODYL 10 MG RE SUPP: 10.0000 mg | Freq: Once | RECTAL | Status: DC

## 2018-04-12 MED ORDER — BISACODYL 10 MG RE SUPP
10.0000 mg | Freq: Once | RECTAL | Status: AC
  Administered 2018-04-12: 10 mg via RECTAL
  Filled 2018-04-12: qty 1

## 2018-04-12 MED ORDER — DEXTROSE 50 % IV SOLN
INTRAVENOUS | Status: AC
  Filled 2018-04-12: qty 50

## 2018-04-12 MED ORDER — DEXTROSE 250 MG/ML IV SOLN
25.0000 g | Freq: Once | INTRAVENOUS | Status: DC
  Filled 2018-04-12: qty 100

## 2018-04-12 MED ORDER — SODIUM BICARBONATE 8.4 % IV SOLN
25.0000 meq | Freq: Once | INTRAVENOUS | Status: AC
  Administered 2018-04-12: 25 meq via INTRAVENOUS
  Filled 2018-04-12: qty 50

## 2018-04-12 MED ORDER — FLEET ENEMA 7-19 GM/118ML RE ENEM: 1.0000 | ENEMA | Freq: Every day | RECTAL | Status: DC | PRN

## 2018-04-12 MED ORDER — DEXTROSE 50 % IV SOLN
25.0000 mL | Freq: Once | INTRAVENOUS | Status: AC
  Administered 2018-04-12: 25 mL via INTRAVENOUS
  Filled 2018-04-12: qty 50

## 2018-04-12 MED ORDER — INSULIN ASPART 100 UNIT/ML IV SOLN
10.0000 [IU] | Freq: Once | INTRAVENOUS | Status: AC
  Administered 2018-04-12: 10 [IU] via INTRAVENOUS
  Filled 2018-04-12: qty 0.1

## 2018-04-12 MED ORDER — FLUCONAZOLE 100MG IVPB
100.0000 mg | INTRAVENOUS | Status: DC
  Administered 2018-04-12 – 2018-04-13 (×2): 100 mg via INTRAVENOUS
  Filled 2018-04-12 (×3): qty 50

## 2018-04-12 MED ORDER — BUDESONIDE 0.5 MG/2ML IN SUSP
0.5000 mg | Freq: Two times a day (BID) | RESPIRATORY_TRACT | Status: DC
  Administered 2018-04-12 – 2018-04-15 (×7): 0.5 mg via RESPIRATORY_TRACT
  Filled 2018-04-12 (×7): qty 2

## 2018-04-12 MED ORDER — ALBUTEROL SULFATE (2.5 MG/3ML) 0.083% IN NEBU: 2.5000 mg | INHALATION_SOLUTION | RESPIRATORY_TRACT | Status: DC | PRN

## 2018-04-12 MED ORDER — SODIUM CHLORIDE 0.9 % IV SOLN
1.0000 g | Freq: Once | INTRAVENOUS | Status: AC
  Administered 2018-04-12: 1 g via INTRAVENOUS
  Filled 2018-04-12: qty 10

## 2018-04-12 MED ORDER — SODIUM POLYSTYRENE SULFONATE 15 GM/60ML PO SUSP
45.0000 g | Freq: Once | ORAL | Status: DC
  Filled 2018-04-12: qty 180

## 2018-04-12 NOTE — Progress Notes (Signed)
Patient ID: MARCHELLO Watson, male   DOB: 02-06-1927, 84 y.o.   MRN: [id number]   [employer] PROGRESS NOTE  Fred Watson EXB:[id number] DOB: 02-Jan-1927 DOA: 04/01/2018 PCP: Mickey Farber, MD  HPI/Subjective: Patient more alert today than yesterday as per the son.  He tried to give him some applesauce this morning and he ate it.  Patient was made n.p.o. yesterday by speech pathology.  Patient slept last night.  Patient following some simple commands and moving all extremities.  Objective: Vitals:   04/12/18 0158 04/12/18 0552  BP:  126/66  Pulse:  70  Resp:  20  Temp:  (!) 97.5 F (36.4 C)  SpO2: 94% 93%    Filed Weights   04/10/18 0444 04/11/18 0513 04/12/18 0500  Weight: 83.3 kg 85.9 kg 86.3 kg    ROS: Review of Systems  Unable to perform ROS: Acuity of condition  Respiratory: Positive for shortness of breath.   Cardiovascular: Negative for chest pain.  Gastrointestinal: Negative for abdominal pain.   Exam: Physical Exam  HENT:  Nose: No mucosal edema.  Mouth/Throat: No oropharyngeal exudate or posterior oropharyngeal edema.  Eyes: Pupils are equal, round, and reactive to light. Conjunctivae and lids are normal.  Neck: No JVD present. Carotid bruit is not present. No edema present. No thyroid mass and no thyromegaly present.  Cardiovascular: S1 normal and S2 normal. An irregularly irregular rhythm present. Exam reveals no gallop.  No murmur heard. Pulses:      Dorsalis pedis pulses are 2+ on the right side, and 2+ on the left side.  Respiratory: No respiratory distress. He has decreased breath sounds in the right lower field and the left lower field. He has wheezes in the right middle field and the left middle field. He has rhonchi in the right lower field and the left lower field. He has no rales.  GI: Soft. Bowel sounds are normal. There is no tenderness.  Musculoskeletal:       Right ankle: He exhibits no swelling.       Left ankle: He exhibits no swelling.   Lymphadenopathy:    He has no cervical adenopathy.  Neurological: He is alert. No cranial nerve deficit.  Skin: Skin is warm. No rash noted. Nails show no clubbing.  Psychiatric: He has a normal mood and affect.      Data Reviewed: Basic Metabolic Panel: Recent Labs  Lab 04/07/18 0502 04/08/18 0646 04/09/18 0511 04/10/18 0612 04/11/18 0305  NA 138 137 137 135 134*  K 4.3 4.7 4.5 4.2 4.6  CL 106 106 105 100 102  CO2 23 25 23  21* 21*  GLUCOSE 186* 201* 254* 290* 202*  BUN 43* 42* 54* 79* 100*  CREATININE 1.50* 1.49* 1.89* 2.59* 2.89*  CALCIUM 7.7* 8.1* 8.3* 8.3* 8.3*   Liver Function Tests: Recent Labs  Lab 04/25/2018 1716  AST 26  ALT 20  ALKPHOS 88  BILITOT 0.8  PROT 6.5  ALBUMIN 3.4*   Recent Labs  Lab 04/04/2018 1716  LIPASE 21  CBC: Recent Labs  Lab 04/01/2018 1716  04/08/18 0646 04/09/18 0511 04/10/18 0612 04/11/18 0305 04/12/18 0713  WBC 24.7*   < > 13.0* 7.7 15.8* 17.9* 14.4*  NEUTROABS 22.2*  --   --   --  15.1*  --   --   HGB 12.4*   < > 11.4* 11.3* 10.8* 10.7* 11.1*  HCT 38.3*   < > 33.4* 34.5* 33.4* 32.5* 32.7*  MCV 97.4   < >  94.7 96.1 97.7 96.4 94.5  PLT 211   < > 213 237 302 327 214   < > = values in this interval not displayed.   Cardiac Enzymes: Recent Labs  Lab 24-Apr-2018 1716  TROPONINI <0.03    CBG: Recent Labs  Lab 04/10/18 2104 04/11/18 0741 04/11/18 1153 04/11/18 1654 04/11/18 2059  GLUCAP 101* 233* 322* 199* 125*    Recent Results (from the past 240 hour(s))  Urine culture     Status: Abnormal   Collection Time: 2018-04-24  5:16 PM  Result Value Ref Range Status   Specimen Description   Final    URINE, RANDOM Performed at [facility], [address], [place], [place] [address]    Special Requests   Final    NONE Performed at [facility], [address] [place], [place] [address]    Culture >=100,000 COLONIES/mL CITROBACTER KOSERI (A)  Final   Report Status 04/08/2018 FINAL  Final   Organism  ID, Bacteria CITROBACTER KOSERI (A)  Final      Susceptibility   Citrobacter koseri - MIC*    CEFAZOLIN <=4 SENSITIVE Sensitive     CEFTRIAXONE <=1 SENSITIVE Sensitive     CIPROFLOXACIN <=0.25 SENSITIVE Sensitive     GENTAMICIN <=1 SENSITIVE Sensitive     IMIPENEM <=0.25 SENSITIVE Sensitive     NITROFURANTOIN 32 SENSITIVE Sensitive     TRIMETH/SULFA <=20 SENSITIVE Sensitive     PIP/TAZO <=4 SENSITIVE Sensitive     * >=100,000 COLONIES/mL CITROBACTER KOSERI  Blood Culture (routine x 2)     Status: None   Collection Time: 04-24-2018  5:17 PM  Result Value Ref Range Status   Specimen Description BLOOD  RT WRIST  Final   Special Requests   Final    BOTTLES DRAWN AEROBIC AND ANAEROBIC Blood Culture adequate volume   Culture   Final    NO GROWTH 5 DAYS Performed at [facility], [address] [place], [place] [address]    Report Status 04/10/2018 FINAL  Final  Blood Culture (routine x 2)     Status: Abnormal   Collection Time: 2018-04-24  5:17 PM  Result Value Ref Range Status   Specimen Description   Final    BLOOD LAC Performed at [facility], [address], [place], [place] [address]    Special Requests   Final    BOTTLES DRAWN AEROBIC AND ANAEROBIC Blood Culture adequate volume Performed at [facility], [address] [place], [place] [address]    Culture  Setup Time   Final    AEROBIC BOTTLE ONLY GRAM POSITIVE COCCI IN CLUSTERS CRITICAL RESULT CALLED TO, READ BACK BY AND VERIFIED WITH: CHRIS NESBITT AT 1540 ON 04/06/18 BY [facility] GRAM STAIN REVIEWED-AGREE WITH RESULT    Culture (A)  Final    STAPHYLOCOCCUS SPECIES (COAGULASE NEGATIVE) THE SIGNIFICANCE OF ISOLATING THIS ORGANISM FROM A SINGLE SET OF BLOOD CULTURES WHEN MULTIPLE SETS ARE DRAWN IS UNCERTAIN. PLEASE NOTIFY THE MICROBIOLOGY DEPARTMENT WITHIN ONE WEEK IF SPECIATION AND SENSITIVITIES ARE REQUIRED. Performed at [facility], [address] [place], [place] [address]    Report  Status 04/08/2018 FINAL  Final  Blood Culture ID Panel (Reflexed)     Status: Abnormal   Collection Time: 04/24/2018  5:17 PM  Result Value Ref Range Status   Enterococcus species NOT DETECTED NOT DETECTED Final   Listeria monocytogenes NOT DETECTED NOT DETECTED Final   Staphylococcus species DETECTED (A) NOT DETECTED Final  Comment: Methicillin (oxacillin) susceptible coagulase negative staphylococcus. Possible blood culture contaminant (unless isolated from more than one blood culture draw or clinical case suggests pathogenicity). No antibiotic treatment is indicated for blood  culture contaminants. CRITICAL RESULT CALLED TO, READ BACK BY AND VERIFIED WITH: CHRIS NESBITT AT 1540 ON 04/06/18 BY SNJ    Staphylococcus aureus (BCID) NOT DETECTED NOT DETECTED Final   Methicillin resistance NOT DETECTED NOT DETECTED Final   Streptococcus species NOT DETECTED NOT DETECTED Final   Streptococcus agalactiae NOT DETECTED NOT DETECTED Final   Streptococcus pneumoniae NOT DETECTED NOT DETECTED Final   Streptococcus pyogenes NOT DETECTED NOT DETECTED Final   Acinetobacter baumannii NOT DETECTED NOT DETECTED Final   Enterobacteriaceae species NOT DETECTED NOT DETECTED Final   Enterobacter cloacae complex NOT DETECTED NOT DETECTED Final   Escherichia coli NOT DETECTED NOT DETECTED Final   Klebsiella oxytoca NOT DETECTED NOT DETECTED Final   Klebsiella pneumoniae NOT DETECTED NOT DETECTED Final   Proteus species NOT DETECTED NOT DETECTED Final   Serratia marcescens NOT DETECTED NOT DETECTED Final   Haemophilus influenzae NOT DETECTED NOT DETECTED Final   Neisseria meningitidis NOT DETECTED NOT DETECTED Final   Pseudomonas aeruginosa NOT DETECTED NOT DETECTED Final   Candida albicans NOT DETECTED NOT DETECTED Final   Candida glabrata NOT DETECTED NOT DETECTED Final   Candida krusei NOT DETECTED NOT DETECTED Final   Candida parapsilosis NOT DETECTED NOT DETECTED Final   Candida tropicalis NOT DETECTED  NOT DETECTED Final    Comment: Performed at [facility], [address] [place], [place] [address]  MRSA PCR Screening     Status: None   Collection Time: 04/06/18  3:25 PM  Result Value Ref Range Status   MRSA by PCR NEGATIVE NEGATIVE Final    Comment:        The GeneXpert MRSA Assay (FDA approved for NASAL specimens only), is one component of a comprehensive MRSA colonization surveillance program. It is not intended to diagnose MRSA infection nor to guide or monitor treatment for MRSA infections. Performed at [facility], [address] [place], [place] [address]      Studies: Dg Chest [place] 1 View  Result Date: 04/11/2018 CLINICAL DATA:  SOB;   CHF, COPD, diabetes, CAD, EXAM: PORTABLE CHEST 1 VIEW COMPARISON:  04/08/2018 FINDINGS: Median sternotomy and CABG. The heart is enlarged. There is atherosclerotic calcification of the thoracic aorta. There is persistent opacity at the LEFT lung base, consistent atelectasis or infiltrate and pleural effusion. There is increasing opacity at the RIGHT lung base, consistent atelectasis and probable effusion. IMPRESSION: Stable cardiomegaly. Persistent atelectasis or infiltrate and effusion at the LEFT base. Increasing opacity at the RIGHT base. Electronically Signed   By: Norva Pavlov M.D.   On: 04/11/2018 11:54    Scheduled Meds: . aspirin EC  81 mg Oral Daily  . bisacodyl  10 mg Rectal Once  . budesonide (PULMICORT) nebulizer solution  0.5 mg Nebulization BID  . cholecalciferol  1,000 Units Oral Daily  . diltiazem  180 mg Oral Daily  . docusate sodium  100 mg Oral BID  . finasteride  5 mg Oral Daily  . FLUoxetine  10 mg Oral Daily  . gabapentin  300 mg Oral BID  . heparin injection (subcutaneous)  5,000 Units Subcutaneous Q8H  . insulin aspart  0-20 Units Subcutaneous TID WC  . ipratropium-albuterol  3 mL Nebulization Q6H  . metoprolol succinate  100 mg Oral Daily  . pravastatin  40 mg  Oral Daily  . sodium  chloride flush  3 mL Intravenous Q12H   Continuous Infusions: . sodium chloride Stopped (04/10/18 1040)  . sodium chloride 50 mL/hr at 04/12/18 0732  . ampicillin-sulbactam (UNASYN) IV Stopped (04/11/18 2355)    Assessment/Plan:  1. Aspiration pneumonia.  Patient did not do well yesterday with swallow evaluation.  Tarted on IV Unasyn.  Speech therapy reevaluation today since he seems more alert as per the family.  Overall prognosis is poor if he is not able to swallow safely. 2. Acute hypoxic hypercapnic respiratory failure.  Patient is off oxygen at this time. 3. Acute kidney injury.  Decrease rate of IV fluids since I am hearing some wheezing the lungs. 4. Acute on chronic systolic and diastolic CHF with EF of 45 to 16%.  Holding off on ACE inhibitor with acute kidney injury.  Hopefully patient will be cleared to take some medications. 5. Sepsis on presentation secondary to urinary tract infection that was treated.  Citrobacter grew out of the urine culture 6. Atrial fibrillation with rapid ventricular response.  Hopefully will be cleared to take medications. 7. Essential hypertension.  Hopefully will be cleared to take medications 8. BPH finasteride if able to take medications 9. Hyperlipidemia on Pravachol if able to take medications 10. Diabetic neuropathy on gabapentin if able to take medications 11. Hyperglycemia secondary to steroids.  On sliding scale only.  Code Status:     Code Status Orders  (From admission, onward)         Start     Ordered   04/11/2018 2151  Full code  Continuous     04/03/2018 2150        Code Status History    Date Active Date Inactive Code Status Order ID Comments User Context   02/22/2018 1443 02/24/2018 2003 Full Code [id number]  Shaune Pollack, MD ED     Family Communication: Son at the bedside Disposition Plan: We will have to get better prior to disposition  Consultants:  Cardiology  Speech therapy  Antibiotics:  Unasyn  Time spent: 30  minutes, including ACP time  [employer]

## 2018-04-12 NOTE — Progress Notes (Signed)
Patient ID: Fred Watson, male   DOB: 1926/11/24, 84 y.o.   MRN: [id number]  ACP note  Patient and son at the bedside  Diagnosis: Aspiration pneumonia, acute hypoxic hypercapnic respiratory failure, acute kidney injury, acute on chronic systolic and diastolic congestive heart failure, sepsis on presentation secondary to urinary tract infection, atrial fibrillation with rapid ventricular response, hypertension, BPH, hyperlipidemia, diabetic neuropathy.  CODE STATUS discussed.  Patient son wishes him to be a full code at this time.  Plan.  Everything will be based off the mental status and whether he is able to swallow or not.  Did not do well yesterday with swallowing.  Son states mental status is better.  Hopefully we can get a reevaluation today from speech pathology about his swallowing.  Continue Unasyn for aspiration pneumonia.  Decrease rate of IV fluids.  Hopefully can get rid of IV fluid shortly if able to swallow.  Overall prognosis is poor if he is unable to swallow.  Time spent on ACP discussion 17 minutes Dr. Alford Highland

## 2018-04-12 NOTE — Progress Notes (Signed)
Progress Note  Patient Name: Fred Watson Date of Encounter: 04/12/2018  Primary Cardiologist: New to [facility] [place]  Subjective   Remains in A. fib with more controlled rates. Renal function now even worse with creatinine up to 3.47 No longer on heparin gtt.   Discussed with Dr. Hilton Sinclair --apparently had worsening episode of aspiration with likely aspiration pneumonia and acute hypoxic hypercapnic [place] failure with worsening renal function.  He had a long discussion with the patient and patient's son.  Continues to be full code.  However concern is waxing and waning of mental status and inability to pass swallow study.  -->  Continued on Unasyn for aspiration pneumonia.  Supposedly he is mental status is improved from yesterday, but he indicates that he thinks he is in [place].  Minimally follows commands.  Inpatient Medications    Scheduled Meds: . aspirin EC  81 mg Oral Daily  . budesonide (PULMICORT) nebulizer solution  0.5 mg Nebulization BID  . cholecalciferol  1,000 Units Oral Daily  . diltiazem  180 mg Oral Daily  . docusate sodium  100 mg Oral BID  . finasteride  5 mg Oral Daily  . FLUoxetine  10 mg Oral Daily  . gabapentin  300 mg Oral BID  . heparin injection (subcutaneous)  5,000 Units Subcutaneous Q8H  . insulin aspart  0-20 Units Subcutaneous TID WC  . insulin aspart  10 Units Intravenous Once  . ipratropium-albuterol  3 mL Nebulization Q6H  . metoprolol succinate  100 mg Oral Daily  . pravastatin  40 mg Oral Daily  . sodium bicarbonate  25 mEq Intravenous Once  . sodium chloride flush  3 mL Intravenous Q12H  . sodium polystyrene  45 g Rectal Once   Continuous Infusions: . sodium chloride Stopped (04/10/18 1040)  . sodium chloride 50 mL/hr at 04/12/18 0732  . ampicillin-sulbactam (UNASYN) IV Stopped (04/11/18 2355)  . calcium gluconate    . dextrose     PRN Meds: sodium chloride, acetaminophen **OR** acetaminophen, diltiazem, labetalol,  ondansetron **OR** ondansetron (ZOFRAN) IV, senna-docusate, sodium phosphate   Vital Signs    Vitals:   04/12/18 0500 04/12/18 0552 04/12/18 0754 04/12/18 0756  BP:  126/66  120/61  Pulse:  70 76 82  Resp:  20 20   Temp:  (!) 97.5 F (36.4 C)  (!) 97.5 F (36.4 C)  TempSrc:  Axillary  Oral  SpO2:  93% 90% 90%  Weight: 86.3 kg     Height:        Intake/Output Summary (Last 24 hours) at 04/12/2018 0806 Last data filed at 04/12/2018 0745 Gross per 24 hour  Intake 1181 ml  Output 150 ml  Net 1031 ml   Filed Weights   04/10/18 0444 04/11/18 0513 04/12/18 0500  Weight: 83.3 kg 85.9 kg 86.3 kg    Telemetry    A. fib with RVR ranging from 70s to 100s. - Personally Reviewed  ECG    n/a - Personally Reviewed  Physical Exam   GEN:  Confused, borderline responsive (but apparently improved).  No obvious distress, but frail and chronically ill-appearing. Neck: No JVD. Cardiac:  Regular rate with irregularly irregular rhythm.  2/6 HSM at base.  No other R/G.  Difficult to auscultate because of couarse respiratory sounds,  Respiratory: Diminished breath sounds bilaterally.  With coarse rhonchorous sounds throughout. GI: Soft, nontender, non-distended.   MS:  Trace edema.  Otherwise no deformity. Neuro:   Awake, but minimally responsive.  Does not necessarily follow commands.  He does grasp hands, but does not track with his eyes.  Does not answer questions appropriately.  Alert to person but not place or time. Psych: Blunted affect.  Very confused  Labs    Chemistry Recent Labs  Lab 04/29/2018 1716  04/10/18 0612 04/11/18 0305 04/12/18 0713  NA 137   < > 135 134* 135  K 5.4*   < > 4.2 4.6 5.7*  CL 103   < > 100 102 103  CO2 24   < > 21* 21* 18*  GLUCOSE 173*   < > 290* 202* 196*  BUN 53*   < > 79* 100* 105*  CREATININE 2.47*   < > 2.59* 2.89* 3.47*  CALCIUM 8.1*   < > 8.3* 8.3* 8.1*  PROT 6.5  --   --   --   --   ALBUMIN 3.4*  --   --   --   --   AST 26  --   --   --    --   ALT 20  --   --   --   --   ALKPHOS 88  --   --   --   --   BILITOT 0.8  --   --   --   --   GFRNONAA 22*   < > 20* 18* 14*  GFRAA 25*   < > 24* 21* 17*  ANIONGAP 10   < > 14 11 14    < > = values in this interval not displayed.     Hematology Recent Labs  Lab 04/10/18 0612 04/11/18 0305 04/12/18 0713  WBC 15.8* 17.9* 14.4*  RBC 3.42* 3.37* 3.46*  HGB 10.8* 10.7* 11.1*  HCT 33.4* 32.5* 32.7*  MCV 97.7 96.4 94.5  MCH 31.6 31.8 32.1  MCHC 32.3 32.9 33.9  RDW 12.6 12.8 13.1  PLT 302 327 214    Cardiac Enzymes Recent Labs  Lab 04/14/2018 1716  TROPONINI <0.03   No results for input(s): TROPIPOC in the last 168 hours.   BNPNo results for input(s): BNP, PROBNP in the last 168 hours.   DDimer No results for input(s): DDIMER in the last 168 hours.   Radiology    Dg Chest Port 1 View  Result Date: 04/11/2018 CLINICAL DATA:  SOB;   CHF, COPD, diabetes, CAD, EXAM: PORTABLE CHEST 1 VIEW COMPARISON:  04/08/2018 FINDINGS: Median sternotomy and CABG. The heart is enlarged. There is atherosclerotic calcification of the thoracic aorta. There is persistent opacity at the LEFT lung base, consistent atelectasis or infiltrate and pleural effusion. There is increasing opacity at the RIGHT lung base, consistent atelectasis and probable effusion. IMPRESSION: Stable cardiomegaly. Persistent atelectasis or infiltrate and effusion at the LEFT base. Increasing opacity at the RIGHT base. Electronically Signed   By: Norva Pavlov M.D.   On: 04/11/2018 11:54    Cardiac Studies   2-D Echo 04/08/2018: Mildly reduced LVEF of 45 to 50%.  Mild aortic insufficiency.  Mild LA dilation.   Patient Profile     84 y.o. male with history of CAD s/p CABG with LIMA-LAD, SVG-D1, SVG-OM1, SVG-PL1 in 04/1996, HFrEF secondary to ICM, CKD stage III, HTN, HLD, COPD secondary to tobacco abuse, chronic UTI, BPH, macular degeneration, and depression who is being seen for new onset Afib with RVR in the setting  of sepsis secondary to PNA and UTI.   Assessment & Plan    1. New onset Afib with RVR:  Remains in Afib with more controlled ventricular rates overnight.   Primary team is already consolidated short-acting diltiazem to long acting Cardizem 180 mg daily (reasonable to use despite concerned about EF)  Continue Toprol 100 mg daily.  Poor DOAC candidate due to falls, frailty and worsening renal function.    .Heparin drip stopped.  2. CAD s/p CABG: Negative troponin.  No complaints of chest pain.  Not an ischemic evaluation candidate.  Continue aspirin and beta-blocker along with statin..  3. HFrEF secondary to ICM: EF 45-50% as above; ischemic evaluation deferred due to advanced age and comorbid conditions.  On Toprol  Clearly ACE inhibitor/ARB and spironolactone not valuable options with CKD and worsening acute on chronic combined renal failure.  Seems relatively euvolemic -> suspect abnormal lung exam related to aspiration.  4. Acute on CKD stage III: Renal function continues to decline.  Suspect that there is a compounding of ATN with likely sepsis.    With no active signs of heart failure, slightly reduced EF should not preclude [place] from using IV fluids.  Nephrology consult pending  5. HTN: Blood pressure reasonably controlled on Toprol and diltiazem.  6. HLD (with goal of less than 70, but age and frailty would warrant less aggressive treatment): Would probably statin while inpatient to avoid any concerns for possible recent worsening hepatic function.   7. Sepsis secondary to PNA and UTI: Now with concern for aspiration pneumonia, has been started on Unasyn. -Per IM -Driving #1   We will follow along.  But if no active changes or recommendations, will not formally round tomorrow.   For questions or updates, please contact [facility] Please consult [email or web address] for contact info under Cardiology/STEMI.    Signed, Bryan Lemma, M.D., M.S. Interventional  Cardiologist   Pager # [phone] Phone # [phone] [address] [place], [place] [address]

## 2018-04-12 NOTE — Evaluation (Signed)
Clinical/Bedside Swallow Evaluation Patient Details  Name: Fred Watson MRN: [id number] Date of Birth: 1927/01/07  Today's Date: 04/12/2018 Time: SLP Start Time (ACUTE ONLY): 0930 SLP Stop Time (ACUTE ONLY): 1030 SLP Time Calculation (min) (ACUTE ONLY): 60 min  Past Medical History:  Past Medical History:  Diagnosis Date  . Arthritis   . Cardiomyopathy (HCC)   . CHF (congestive heart failure) (HCC)   . COPD (chronic obstructive pulmonary disease) (HCC)   . Coronary artery disease   . Depression   . Diabetes mellitus without complication (HCC)   . Hearing loss   . Hyperlipidemia   . Hypertension   . Macular degeneration    Past Surgical History:  Past Surgical History:  Procedure Laterality Date  . CATARACT EXTRACTION W/PHACO Left 11/12/2017   Procedure: CATARACT EXTRACTION PHACO AND INTRAOCULAR LENS PLACEMENT (IOC) LEFT DIABETIC;  Surgeon: Nevada Crane, MD;  Location: [facility];  Service: Ophthalmology;  Laterality: Left;  Diabetic  . CHOLECYSTECTOMY    . CORONARY ARTERY BYPASS GRAFT    . HERNIA REPAIR     HPI:  Pt is a 84 y.o. male with a known history of multiple medical issues including cardiomyopathy, congestive heart failure, COPD, coronary artery disease, type 2 diabetes mellitus, hyperlipidemia, hypertension, macular degeneration presented to the emergency room for generalized weakness.  Patient was unable to get up and move around.  Uses a rolling walker at home.  Lives with his son at home.  Patient also had low blood pressure at presentation.  He has history of recurrent urinary tract infections in the past.  He was evaluated in the emergency room found to be having elevated WBC count but urinalysis showed infection.  Code sepsis was called.  Pt is HOH.  Unsure of his baseline Cognitive status as some Confusion was noted during interaction/engagement. Pt has been NPO.   Assessment / Plan / Recommendation Clinical Impression  Pt was more alert and  talkative today from yesterday so a BSE was performed. He appears to present w/ pharyngeal phase dysphagia w/ overall generalized oral weakness d/t extended illness(no unilateral weakness noted). Pt also presents w/ heavy coating on/around tongue and orally w/ c/o sore throat - suspected Thrush. He has min confusion during engagement and requires Mod cues for follow through. His risk for aspiration from all of these factors is increased at this time. Dentures were removed to clean and MD consulted for medication for tx of Thrush.  Pt was given TSP trials of Honey consistency and 1/2 tsp amounts of puree w/ no immediate, overt s/s of aspiration noted. Pt exhibited fair oral phase control during bolus management w/ timely swallow preparation and engagement w/ adequate oral clearing b/t trials. However, noted multiple swallows in attempts to fully clear the bolus trials. Unsure if this is related to the suspected Thrush; thick coating and dryness/stickiness. Alternating b/t puree and Honey liquids appeared functional for pt, AND time b/t each trial. Toward end of trials, pt did exhibit a hacky, congested cough but this did not appear in response to individual po trials given as vocal quality remained clear post trials and there was no decline in respiratory status overall post trials. MD was consulted re: pt's presentation and agreed w/ trial initiation of an oral diet w/ strict precautions. Recommended a Dysphagia level 1 (puree) w/ Honey consistency liquids by TSP; aspiration precautions; 100% supervision during meals - stopping the po's if decline in status noted. NSG updated.  SLP Visit Diagnosis: Dysphagia, oropharyngeal phase (  R13.12)    Aspiration Risk  Moderate aspiration risk;Risk for inadequate nutrition/hydration    Diet Recommendation  Dysphagia level 1 (PUREE) w/ HONEY Consistency liquids VIA TSP only at this time. Strict aspiration precautions and Feeding Support at meals to monitor  status  Medication Administration: Crushed with puree(as able)    Other  Recommendations Recommended Consults: (Dietician f/u; Palliative Care consult for GOC) Oral Care Recommendations: Oral care QID;Staff/trained caregiver to provide oral care(d/t suspected Thrush) Other Recommendations: Order thickener from pharmacy;Prohibited food (jello, ice cream, thin soups);Remove water pitcher;Have oral suction available   Follow up Recommendations (TBD)      Frequency and Duration min 3x week  2 weeks       Prognosis Prognosis for Safe Diet Advancement: Guarded(-Fair) Barriers to Reach Goals: Cognitive deficits(Thrush)      Swallow Study   General Date of Onset: May 01, 2018 HPI: Pt is a 84 y.o. male with a known history of multiple medical issues including cardiomyopathy, congestive heart failure, COPD, coronary artery disease, type 2 diabetes mellitus, hyperlipidemia, hypertension, macular degeneration presented to the emergency room for generalized weakness.  Patient was unable to get up and move around.  Uses a rolling walker at home.  Lives with his son at home.  Patient also had low blood pressure at presentation.  He has history of recurrent urinary tract infections in the past.  He was evaluated in the emergency room found to be having elevated WBC count but urinalysis showed infection.  Code sepsis was called.  Pt is HOH.  Unsure of his baseline Cognitive status as some Confusion was noted during interaction/engagement. Pt has been NPO. Type of Study: Bedside Swallow Evaluation Previous Swallow Assessment: attempted MBSS yesterday d/t concern for aspiration/dysphagia but pt was too lethargic and not engaged in tasks Diet Prior to this Study: NPO(regular diet at home per family) Temperature Spikes Noted: No(wbc 14.4) Respiratory Status: Room air History of Recent Intubation: No Behavior/Cognition: Alert;Cooperative;Pleasant mood;Confused;Distractible;Requires cueing(HOH) Oral Cavity  Assessment: Dry;Dried secretions(heavy Coating on tongue, sides and underneath) Oral Care Completed by SLP: Yes(removed Top denture plate and let clean w/ bottom one) Oral Cavity - Dentition: Dentures, top;Dentures, bottom(removed d/t suspected THrush) Vision: (n/a) Self-Feeding Abilities: Total assist Patient Positioning: Upright in bed(needed positioning) Baseline Vocal Quality: Low vocal intensity(but fairly intelligible) Volitional Cough: Weak(-Fair) Volitional Swallow: Able to elicit(w/ cues)    Oral/Motor/Sensory Function Overall Oral Motor/Sensory Function: Generalized oral weakness(but no unilateral weakness noted during boluses/speech)   Ice Chips Ice chips: Not tested   Thin Liquid Thin Liquid: Not tested    Nectar Thick Nectar Thick Liquid: Not tested   Honey Thick Honey Thick Liquid: Impaired(appeared grossly WFL ) Presentation: Spoon(fed; 10 trials) Oral Phase Impairments: (adequate) Oral Phase Functional Implications: (adequate) Pharyngeal Phase Impairments: Multiple swallows(suspect d/t dryness; Thrush)   Puree Puree: Impaired(appeared grossly WFL) Presentation: Spoon(fed; 10 (1/2) tsp trials) Oral Phase Impairments: (adequate) Oral Phase Functional Implications: (adequate) Pharyngeal Phase Impairments: Multiple swallows(suspect d/t dryness; Thrush)   Solid     Solid: Not tested       Jerilynn Som, MS, CCC-SLP [facility] [place] 04/12/2018,10:38 AM

## 2018-04-12 NOTE — Progress Notes (Signed)
IS and Flutter education attempted, pt needs more education. Flutter non-productive, IS pt too tired to comprehend instructions

## 2018-04-12 NOTE — Consult Note (Signed)
Date: 04/12/2018                  Patient Name:  BERTHA EARWOOD  MRN: [id number]  DOB: 21-Sep-1926  Age / Sex: 84 y.o., male         PCP: Mickey Farber, MD                 Service Requesting Consult: IM/ Alford Highland, MD                 Reason for Consult: ARF            History of Present Illness: Patient is a 84 y.o. male with medical problems of COPD, CHF, CAD, DM-2, HTN, who was admitted to [facility] on 04/06/2018 for evaluation of generalized weakness.  Information obtained from family as patient is hard of hearing and is poor historian.  He is not able to recognize his family and is delirious. He was first brought into the emergency room for generalized weakness.  His grandson reports that vision is able to ambulate at home with a rolling walker.  He was initially diagnosed with urinary tract infection and sepsis.  Admitted for further evaluation and management. Patient's hospital stays complicated by acute renal failure.  His baseline creatinine  is 1.49/GFR 40 from October 8 at the time of admission   Medications: Outpatient medications: Medications Prior to Admission  Medication Sig Dispense Refill Last Dose  . aspirin EC 81 MG tablet Take 81 mg by mouth daily.   04/04/2018 at Unknown time  . cholecalciferol (VITAMIN D) 1000 units tablet Take 1,000 Units by mouth daily.   Unknown at Unknown  . docusate sodium (COLACE) 100 MG capsule Take 100 mg by mouth 2 (two) times daily.   Unknown at Unknown  . finasteride (PROSCAR) 5 MG tablet Take 5 mg by mouth daily.   Unknown at Unknown  . FLUoxetine (PROZAC) 10 MG capsule Take 1 capsule by mouth daily.   Unknown at Unknown  . furosemide (LASIX) 40 MG tablet Take 1 tablet by mouth daily.   Unknown at Unknown  . gabapentin (NEURONTIN) 300 MG capsule Take 300 mg by mouth 2 (two) times daily.   Unknown at Unknown  . glimepiride (AMARYL) 2 MG tablet Take 1 tablet (2 mg total) by mouth daily with breakfast. 30 tablet 1 Unknown at Unknown  .  lisinopril (PRINIVIL,ZESTRIL) 20 MG tablet Take 20 mg by mouth daily.   Unknown at Unknown  . metoprolol succinate (TOPROL-XL) 100 MG 24 hr tablet Take 100 mg by mouth daily. Take with or immediately following a meal.   Unknown at Unknown  . pravastatin (PRAVACHOL) 40 MG tablet Take 40 mg by mouth daily.   Unknown at Unknown    Current medications: Current Facility-Administered Medications  Medication Dose Route Frequency Provider Last Rate Last Dose  . 0.9 %  sodium chloride infusion   Intravenous PRN Houston Siren, MD   Stopped at 04/10/18 1040  . 0.9 %  sodium chloride infusion   Intravenous Continuous Alford Highland, MD 50 mL/hr at 04/12/18 0805    . acetaminophen (TYLENOL) tablet 650 mg  650 mg Oral Q6H PRN Ihor Austin, MD       Or  . acetaminophen (TYLENOL) suppository 650 mg  650 mg Rectal Q6H PRN Pyreddy, Pavan, MD      . Ampicillin-Sulbactam (UNASYN) 3 g in sodium chloride 0.9 % 100 mL IVPB  3 g Intravenous Q12H Shanlever, Charmayne Sheer, [facility]  Stopped at 04/11/18 2355  . aspirin EC tablet 81 mg  81 mg Oral Daily Ihor Austin, MD   81 mg at 04/11/18 5784  . budesonide (PULMICORT) nebulizer solution 0.5 mg  0.5 mg Nebulization BID Alford Highland, MD   0.5 mg at 04/12/18 0754  . calcium gluconate 1 g in sodium chloride 0.9 % 100 mL IVPB  1 g Intravenous Once Alford Highland, MD 110 mL/hr at 04/12/18 0953 1 g at 04/12/18 0953  . cholecalciferol (VITAMIN D) tablet 1,000 Units  1,000 Units Oral Daily Ihor Austin, MD   1,000 Units at 04/11/18 6962  . dextrose 50 % solution           . diltiazem (CARDIZEM CD) 24 hr capsule 180 mg  180 mg Oral Daily Eula Listen M, PA-C   180 mg at 04/11/18 9528  . diltiazem (CARDIZEM) injection 10 mg  10 mg Intravenous Q4H PRN Houston Siren, MD      . docusate sodium (COLACE) capsule 100 mg  100 mg Oral BID Ihor Austin, MD   100 mg at 04/11/18 0823  . finasteride (PROSCAR) tablet 5 mg  5 mg Oral Daily Pyreddy, Vivien Rota, MD   5 mg at 04/11/18 4132   . fluconazole (DIFLUCAN) IVPB 100 mg  100 mg Intravenous Q24H Wieting, Richard, MD      . FLUoxetine (PROZAC) capsule 10 mg  10 mg Oral Daily Pyreddy, Vivien Rota, MD   10 mg at 04/11/18 0822  . heparin injection 5,000 Units  5,000 Units Subcutaneous Q8H Houston Siren, MD   5,000 Units at 04/12/18 [phone]  . insulin aspart (novoLOG) injection 0-20 Units  0-20 Units Subcutaneous TID WC Conforti, John, DO   4 Units at 04/12/18 0851  . ipratropium-albuterol (DUONEB) 0.5-2.5 (3) MG/3ML nebulizer solution 3 mL  3 mL Nebulization Q6H Houston Siren, MD   3 mL at 04/12/18 0754  . labetalol (NORMODYNE,TRANDATE) injection 10 mg  10 mg Intravenous Q2H PRN Oralia Manis, MD   10 mg at 04/08/18 0151  . metoprolol succinate (TOPROL-XL) 24 hr tablet 100 mg  100 mg Oral Daily Pyreddy, Vivien Rota, MD   100 mg at 04/11/18 0272  . ondansetron (ZOFRAN) tablet 4 mg  4 mg Oral Q6H PRN Ihor Austin, MD       Or  . ondansetron (ZOFRAN) injection 4 mg  4 mg Intravenous Q6H PRN Pyreddy, Pavan, MD      . pravastatin (PRAVACHOL) tablet 40 mg  40 mg Oral Daily Pyreddy, Vivien Rota, MD   40 mg at 04/11/18 0823  . senna-docusate (Senokot-S) tablet 1 tablet  1 tablet Oral QHS PRN Pyreddy, Pavan, MD      . sodium chloride flush (NS) 0.9 % injection 3 mL  3 mL Intravenous Q12H Houston Siren, MD   3 mL at 04/11/18 2324  . sodium phosphate (FLEET) 7-19 GM/118ML enema 1 enema  1 enema Rectal Daily PRN Wieting, Richard, MD      . sodium polystyrene (KAYEXALATE) 15 GM/60ML suspension 45 g  45 g Rectal Once Alford Highland, MD          Allergies: Allergies  Allergen Reactions  . Ezetimibe Other (See Comments)  . Niacin Other (See Comments)    Flushing  . Ace Inhibitors Cough    Tolerating lisinopril 20 mg without side effects 02/01/2015  . Atorvastatin Other (See Comments)    Nightmares  . Losartan Other (See Comments)      Past Medical History: Past Medical History:  Diagnosis Date  . Arthritis   . Cardiomyopathy (HCC)   .  CHF (congestive heart failure) (HCC)   . COPD (chronic obstructive pulmonary disease) (HCC)   . Coronary artery disease   . Depression   . Diabetes mellitus without complication (HCC)   . Hearing loss   . Hyperlipidemia   . Hypertension   . Macular degeneration      Past Surgical History: Past Surgical History:  Procedure Laterality Date  . CATARACT EXTRACTION W/PHACO Left 11/12/2017   Procedure: CATARACT EXTRACTION PHACO AND INTRAOCULAR LENS PLACEMENT (IOC) LEFT DIABETIC;  Surgeon: Nevada Crane, MD;  [facility];  Service: Ophthalmology;  Laterality: Left;  Diabetic  . CHOLECYSTECTOMY    . CORONARY ARTERY BYPASS GRAFT    . HERNIA REPAIR       Family History: Family History  Problem Relation Age of Onset  . Stroke Mother   . CAD Father   . Heart attack Father      Social History: Social History   Socioeconomic History  . Marital status: Widowed    Spouse name: Not on file  . Number of children: Not on file  . Years of education: Not on file  . Highest education level: Not on file  Occupational History  . Not on file  Social Needs  . Financial resource strain: Patient refused  . Food insecurity:    Worry: Patient refused    Inability: Patient refused  . Transportation needs:    Medical: Patient refused    Non-medical: Patient refused  Tobacco Use  . Smoking status: Former Smoker    Packs/day: 1.00    Last attempt to quit: 04/05/1958    Years since quitting: 60.0  . Smokeless tobacco: Never Used  Substance and Sexual Activity  . Alcohol use: Not on file  . Drug use: Not on file  . Sexual activity: Not on file  Lifestyle  . Physical activity:    Days per week: Patient refused    Minutes per session: Patient refused  . Stress: Patient refused  Relationships  . Social connections:    Talks on phone: Patient refused    Gets together: Patient refused    Attends religious service: Patient refused    Active member of club or  organization: Patient refused    Attends meetings of clubs or organizations: Patient refused    Relationship status: Patient refused  . Intimate partner violence:    Fear of current or ex partner: Patient refused    Emotionally abused: Patient refused    Physically abused: Patient refused    Forced sexual activity: Patient refused  Other Topics Concern  . Not on file  Social History Narrative   Son helps with medications daily     Review of Systems: Not available due to patient's poor general condition Gen:  HEENT:  CV:  Resp:  GI: GU :  MS:  Derm:   Psych: Heme:  Neuro:  Endocrine  Vital Signs: Blood pressure 120/61, pulse 82, temperature (!) 97.5 F (36.4 C), temperature source Oral, resp. rate 20, height 6' (1.829 m), weight 86.3 kg, SpO2 90 %.   Intake/Output Summary (Last 24 hours) at 04/12/2018 0954 Last data filed at 04/12/2018 0745 Gross per 24 hour  Intake 1181 ml  Output 150 ml  Net 1031 ml    Weight trends: Filed Weights   04/10/18 0444 04/11/18 0513 04/12/18 0500  Weight: 83.3 kg 85.9 kg 86.3 kg    Physical Exam: General:  Frail, elderly, chronically ill-appearing  HEENT  hearing aids in place, dry oral mucous membranes  Neck:  Supple  Lungs:  Bilateral rhonchi and coarse breath sounds  Heart::  2/6 systolic murmur, irregular rhythm  Abdomen:  Soft, nontender, nondistended  Extremities:  No peripheral edema  Neurologic:  Able to follow few simple commands, not oriented, was not able to recognize his family  Skin:  Scattered bruises   Lab results: Basic Metabolic Panel: Recent Labs  Lab 04/10/18 0612 04/11/18 0305 04/12/18 0713  NA 135 134* 135  K 4.2 4.6 5.7*  CL 100 102 103  CO2 21* 21* 18*  GLUCOSE 290* 202* 196*  BUN 79* 100* 105*  CREATININE 2.59* 2.89* 3.47*  CALCIUM 8.3* 8.3* 8.1*    Liver Function Tests: Recent Labs  Lab 04-11-18 1716  AST 26  ALT 20  ALKPHOS 88  BILITOT 0.8  PROT 6.5  ALBUMIN 3.4*   Recent  Labs  Lab 04-11-2018 1716  LIPASE 21   No results for input(s): AMMONIA in the last 168 hours.  CBC: Recent Labs  Lab 04-11-18 1716  04/10/18 0612 04/11/18 0305 04/12/18 0713  WBC 24.7*   < > 15.8* 17.9* 14.4*  NEUTROABS 22.2*  --  15.1*  --   --   HGB 12.4*   < > 10.8* 10.7* 11.1*  HCT 38.3*   < > 33.4* 32.5* 32.7*  MCV 97.4   < > 97.7 96.4 94.5  PLT 211   < > 302 327 214   < > = values in this interval not displayed.    Cardiac Enzymes: Recent Labs  Lab 04-11-2018 1716  TROPONINI <0.03    BNP: Invalid input(s): POCBNP  CBG: Recent Labs  Lab 04/11/18 0741 04/11/18 1153 04/11/18 1654 04/11/18 2059 04/12/18 0757  GLUCAP 233* 322* 199* 125* 192*    Microbiology: Recent Results (from the past 720 hour(s))  Urine culture     Status: Abnormal   Collection Time: 04/11/2018  5:16 PM  Result Value Ref Range Status   Specimen Description   Final    URINE, RANDOM Performed at [facility], [address], [place], [place] [address]    Special Requests   Final    NONE Performed at [facility], [address] [place], [place] [address]    Culture >=100,000 COLONIES/mL CITROBACTER KOSERI (A)  Final   Report Status 04/08/2018 FINAL  Final   Organism ID, Bacteria CITROBACTER KOSERI (A)  Final      Susceptibility   Citrobacter koseri - MIC*    CEFAZOLIN <=4 SENSITIVE Sensitive     CEFTRIAXONE <=1 SENSITIVE Sensitive     CIPROFLOXACIN <=0.25 SENSITIVE Sensitive     GENTAMICIN <=1 SENSITIVE Sensitive     IMIPENEM <=0.25 SENSITIVE Sensitive     NITROFURANTOIN 32 SENSITIVE Sensitive     TRIMETH/SULFA <=20 SENSITIVE Sensitive     PIP/TAZO <=4 SENSITIVE Sensitive     * >=100,000 COLONIES/mL CITROBACTER KOSERI  Blood Culture (routine x 2)     Status: None   Collection Time: April 11, 2018  5:17 PM  Result Value Ref Range Status   Specimen Description BLOOD  RT WRIST  Final   Special Requests   Final    BOTTLES DRAWN AEROBIC AND ANAEROBIC Blood Culture  adequate volume   Culture   Final    NO GROWTH 5 DAYS Performed at [facility], [address] [place], [place] [address]    Report Status 04/10/2018 FINAL  Final  Blood Culture (routine x 2)     Status: Abnormal   Collection Time: 04-13-18  5:17 PM  Result Value Ref Range Status   Specimen Description   Final    BLOOD LAC Performed at [facility], [address], [place], [place] [address]    Special Requests   Final    BOTTLES DRAWN AEROBIC AND ANAEROBIC Blood Culture adequate volume Performed at [facility], [address] [place], [place] [address]    Culture  Setup Time   Final    AEROBIC BOTTLE ONLY GRAM POSITIVE COCCI IN CLUSTERS CRITICAL RESULT CALLED TO, READ BACK BY AND VERIFIED WITH: CHRIS NESBITT AT 1540 ON 04/06/18 BY [facility] GRAM STAIN REVIEWED-AGREE WITH RESULT    Culture (A)  Final    STAPHYLOCOCCUS SPECIES (COAGULASE NEGATIVE) THE SIGNIFICANCE OF ISOLATING THIS ORGANISM FROM A SINGLE SET OF BLOOD CULTURES WHEN MULTIPLE SETS ARE DRAWN IS UNCERTAIN. PLEASE NOTIFY THE MICROBIOLOGY DEPARTMENT WITHIN ONE WEEK IF SPECIATION AND SENSITIVITIES ARE REQUIRED. Performed at [facility], [address], [place], [place] [address]    Report Status 04/08/2018 FINAL  Final  Blood Culture ID Panel (Reflexed)     Status: Abnormal   Collection Time: 2018/04/13  5:17 PM  Result Value Ref Range Status   Enterococcus species NOT DETECTED NOT DETECTED Final   Listeria monocytogenes NOT DETECTED NOT DETECTED Final   Staphylococcus species DETECTED (A) NOT DETECTED Final    Comment: Methicillin (oxacillin) susceptible coagulase negative staphylococcus. Possible blood culture contaminant (unless isolated from more than one blood culture draw or clinical case suggests pathogenicity). No antibiotic treatment is indicated for blood  culture contaminants. CRITICAL RESULT CALLED TO, READ BACK BY AND VERIFIED WITH: CHRIS NESBITT AT 1540 ON 04/06/18 BY [facility]     Staphylococcus aureus (BCID) NOT DETECTED NOT DETECTED Final   Methicillin resistance NOT DETECTED NOT DETECTED Final   Streptococcus species NOT DETECTED NOT DETECTED Final   Streptococcus agalactiae NOT DETECTED NOT DETECTED Final   Streptococcus pneumoniae NOT DETECTED NOT DETECTED Final   Streptococcus pyogenes NOT DETECTED NOT DETECTED Final   Acinetobacter baumannii NOT DETECTED NOT DETECTED Final   Enterobacteriaceae species NOT DETECTED NOT DETECTED Final   Enterobacter cloacae complex NOT DETECTED NOT DETECTED Final   Escherichia coli NOT DETECTED NOT DETECTED Final   Klebsiella oxytoca NOT DETECTED NOT DETECTED Final   Klebsiella pneumoniae NOT DETECTED NOT DETECTED Final   Proteus species NOT DETECTED NOT DETECTED Final   Serratia marcescens NOT DETECTED NOT DETECTED Final   Haemophilus influenzae NOT DETECTED NOT DETECTED Final   Neisseria meningitidis NOT DETECTED NOT DETECTED Final   Pseudomonas aeruginosa NOT DETECTED NOT DETECTED Final   Candida albicans NOT DETECTED NOT DETECTED Final   Candida glabrata NOT DETECTED NOT DETECTED Final   Candida krusei NOT DETECTED NOT DETECTED Final   Candida parapsilosis NOT DETECTED NOT DETECTED Final   Candida tropicalis NOT DETECTED NOT DETECTED Final    Comment: Performed at [facility], [address] [place], [place] [address]  MRSA PCR Screening     Status: None   Collection Time: 04/06/18  3:25 PM  Result Value Ref Range Status   MRSA by PCR NEGATIVE NEGATIVE Final    Comment:        The GeneXpert MRSA Assay (FDA approved for NASAL specimens only), is one component of a comprehensive MRSA colonization surveillance program. It is not intended to diagnose MRSA infection nor to guide or monitor treatment for  MRSA infections. Performed at [facility], [address] [place], [place] [address]      Coagulation Studies: Recent Labs    04/10/18 0859  LABPROT 13.7  INR 1.06    Urinalysis: No  results for input(s): COLORURINE, LABSPEC, PHURINE, GLUCOSEU, HGBUR, BILIRUBINUR, KETONESUR, PROTEINUR, UROBILINOGEN, NITRITE, LEUKOCYTESUR in the last 72 hours.  Invalid input(s): APPERANCEUR      Imaging: Dg Chest Port 1 View  Result Date: 04/11/2018 CLINICAL DATA:  SOB;   CHF, COPD, diabetes, CAD, EXAM: PORTABLE CHEST 1 VIEW COMPARISON:  04/08/2018 FINDINGS: Median sternotomy and CABG. The heart is enlarged. There is atherosclerotic calcification of the thoracic aorta. There is persistent opacity at the LEFT lung base, consistent atelectasis or infiltrate and pleural effusion. There is increasing opacity at the RIGHT lung base, consistent atelectasis and probable effusion. IMPRESSION: Stable cardiomegaly. Persistent atelectasis or infiltrate and effusion at the LEFT base. Increasing opacity at the RIGHT base. Electronically Signed   By: Norva Pavlov M.D.   On: 04/11/2018 11:54      Assessment & Plan: Pt is a 84 y.o. Caucasian  male with medical problems of COPD, CHF, CAD, DM-2, HTN, atrial fibrillation who was admitted to [facility] on 04/17/2018 with generalized weakness.   1.  Acute renal failure 2.  Urinary tract infection, Citrobacter 3.  Hyperkalemia 4.  Altered mental status 5. Concern about aspiration pneumonia  Patient has baseline fragile health and presently critically ill with multisystem involvement.  He has a urinary tract infection with Citrobacter.  Atrial fibrillation with hemodynamic instability.  Worsening renal failure with hyperkalemia.  He has developed altered mental status and confusion likely due to underlying illnesses. There are concerns about aspiration pneumonia.  Potassium is currently being managed with rectal Kayexalate.  We will obtain renal ultrasound to rule out obstruction.  Overall prognosis appears to be poor.  If renal function continues to worsen, renal replacement therapy may be required but at his age and with poor general condition not sure if he  will be able to tolerate it.  Recommend palliative care discussion.      LOS: 7 Lovelle Lema Thedore Mins 10/12/20199:54 AM  [facility] [place], [place] [phone]  Note: This note was prepared with Dragon dictation. Any transcription errors are unintentional

## 2018-04-12 NOTE — Progress Notes (Signed)
Patient ID: Fred Watson, male   DOB: [birth year], 84 y.o.   MRN: [id number]  Creatinine worsened with this morning's labs coming back and also has hyperkalemia.  I ordered hyperkalemia medications with Kayexalate enema, calcium, sodium bicarb insulin and D50.  Recheck potassium later.  Continue IV fluids.  Discontinue gabapentin.  Nephrology consultation.  Need to watch status closely since patient is a full code and also failed swallow evaluation yesterday.  Dr. Alford Highland

## 2018-04-13 ENCOUNTER — Inpatient Hospital Stay: Payer: Medicare Other

## 2018-04-13 LAB — GLUCOSE, CAPILLARY
GLUCOSE-CAPILLARY: 183 mg/dL — AB (ref 70–99)
GLUCOSE-CAPILLARY: 186 mg/dL — AB (ref 70–99)
Glucose-Capillary: 163 mg/dL — ABNORMAL HIGH (ref 70–99)
Glucose-Capillary: 164 mg/dL — ABNORMAL HIGH (ref 70–99)

## 2018-04-13 LAB — BASIC METABOLIC PANEL
Anion gap: 9 (ref 5–15)
BUN: 98 mg/dL — ABNORMAL HIGH (ref 8–23)
CHLORIDE: 106 mmol/L (ref 98–111)
CO2: 24 mmol/L (ref 22–32)
CREATININE: 2.88 mg/dL — AB (ref 0.61–1.24)
Calcium: 8.1 mg/dL — ABNORMAL LOW (ref 8.9–10.3)
GFR calc Af Amer: 21 mL/min — ABNORMAL LOW (ref >60)
GFR calc non Af Amer: 18 mL/min — ABNORMAL LOW (ref >60)
GLUCOSE: 185 mg/dL — AB (ref 70–99)
Potassium: 4.6 mmol/L (ref 3.5–5.1)
SODIUM: 139 mmol/L (ref 135–145)

## 2018-04-13 MED ORDER — SODIUM CHLORIDE 0.9 % IV SOLN
INTRAVENOUS | Status: DC
  Administered 2018-04-13: 09:00:00 via INTRAVENOUS

## 2018-04-13 MED ORDER — FUROSEMIDE 10 MG/ML IJ SOLN
20.0000 mg | Freq: Once | INTRAMUSCULAR | Status: AC
  Administered 2018-04-13: 20 mg via INTRAVENOUS
  Filled 2018-04-13: qty 2

## 2018-04-13 MED ORDER — TRAZODONE HCL 50 MG PO TABS
50.0000 mg | ORAL_TABLET | Freq: Every evening | ORAL | Status: DC | PRN
  Administered 2018-04-14: 50 mg via ORAL
  Filled 2018-04-13: qty 1

## 2018-04-13 NOTE — Plan of Care (Signed)
Encourage frequent use of flutter valve and incentive spiro. Patient is having difficulty performing these exercises

## 2018-04-13 NOTE — Progress Notes (Signed)
Notified MD of pt with crackles in lungs. Pt becoming restless. Orders placed. Wiill continue to monitor and assess.

## 2018-04-13 NOTE — Progress Notes (Signed)
Patient ID: Fred Watson, male   DOB: 08/24/1926, 84 y.o.   MRN: [id number]   [facility] PROGRESS NOTE  Fred Watson NWG:[id number] DOB: 1926/12/27 DOA: 03-May-2018 PCP: Mickey Farber, MD  HPI/Subjective: Patient doing a little bit better today.  States he is hungry and thirsty.  Answers a few more questions today than yesterday.  Objective: Vitals:   04/13/18 0203 04/13/18 0416  BP:  (!) 142/87  Pulse:  92  Resp:    Temp:  97.6 F (36.4 C)  SpO2: 92% 91%    Filed Weights   04/11/18 0513 04/12/18 0500 04/13/18 0416  Weight: 85.9 kg 86.3 kg 86.4 kg    ROS: Review of Systems  Unable to perform ROS: Acuity of condition  Respiratory: Positive for cough and shortness of breath.   Cardiovascular: Negative for chest pain.  Gastrointestinal: Negative for abdominal pain, nausea and vomiting.   Exam: Physical Exam  HENT:  Nose: No mucosal edema.  Mouth/Throat: No oropharyngeal exudate or posterior oropharyngeal edema.  Eyes: Pupils are equal, round, and reactive to light. Conjunctivae and lids are normal.  Neck: No JVD present. Carotid bruit is not present. No edema present. No thyroid mass and no thyromegaly present.  Cardiovascular: S1 normal and S2 normal. An irregularly irregular rhythm present. Exam reveals no gallop.  No murmur heard. Pulses:      Dorsalis pedis pulses are 2+ on the right side, and 2+ on the left side.  Respiratory: No respiratory distress. He has decreased breath sounds in the right lower field and the left lower field. He has wheezes in the right middle field and the left middle field. He has rhonchi in the right lower field and the left lower field. He has no rales.  GI: Soft. Bowel sounds are normal. There is no tenderness.  Musculoskeletal:       Right ankle: He exhibits no swelling.       Left ankle: He exhibits no swelling.  Lymphadenopathy:    He has no cervical adenopathy.  Neurological: He is alert. No cranial nerve deficit.  Skin: Skin is  warm. No rash noted. Nails show no clubbing.  Psychiatric: He has a normal mood and affect.      Data Reviewed: Basic Metabolic Panel: Recent Labs  Lab 04/10/18 0612 04/11/18 0305 04/12/18 0713 04/12/18 1422 04/13/18 0528  NA 135 134* 135 137 139  K 4.2 4.6 5.7* 4.4 4.6  CL 100 102 103 105 106  CO2 21* 21* 18* 23 24  GLUCOSE 290* 202* 196* 233* 185*  BUN 79* 100* 105* 105* 98*  CREATININE 2.59* 2.89* 3.47* 3.09* 2.88*  CALCIUM 8.3* 8.3* 8.1* 8.1* 8.1*   CBC: Recent Labs  Lab 04/08/18 0646 04/09/18 0511 04/10/18 0612 04/11/18 0305 04/12/18 0713  WBC 13.0* 7.7 15.8* 17.9* 14.4*  NEUTROABS  --   --  15.1*  --   --   HGB 11.4* 11.3* 10.8* 10.7* 11.1*  HCT 33.4* 34.5* 33.4* 32.5* 32.7*  MCV 94.7 96.1 97.7 96.4 94.5  PLT 213 237 302 327 214    CBG: Recent Labs  Lab 04/12/18 0757 04/12/18 1143 04/12/18 1704 04/12/18 2054 04/13/18 0754  GLUCAP 192* 214* 217* 163* 163*    Recent Results (from the past 240 hour(s))  Urine culture     Status: Abnormal   Collection Time: 05/03/2018  5:16 PM  Result Value Ref Range Status   Specimen Description   Final    URINE, RANDOM Performed at [facility]  [facility], [address] [place], [place] [address]    Special Requests   Final    NONE Performed at [facility], [address] [place], [place] [address]    Culture >=100,000 COLONIES/mL CITROBACTER KOSERI (A)  Final   Report Status 04/08/2018 FINAL  Final   Organism ID, Bacteria CITROBACTER KOSERI (A)  Final      Susceptibility   Citrobacter koseri - MIC*    CEFAZOLIN <=4 SENSITIVE Sensitive     CEFTRIAXONE <=1 SENSITIVE Sensitive     CIPROFLOXACIN <=0.25 SENSITIVE Sensitive     GENTAMICIN <=1 SENSITIVE Sensitive     IMIPENEM <=0.25 SENSITIVE Sensitive     NITROFURANTOIN 32 SENSITIVE Sensitive     TRIMETH/SULFA <=20 SENSITIVE Sensitive     PIP/TAZO <=4 SENSITIVE Sensitive     * >=100,000 COLONIES/mL CITROBACTER KOSERI  Blood Culture (routine x 2)      Status: None   Collection Time: 04/15/2018  5:17 PM  Result Value Ref Range Status   Specimen Description BLOOD  RT WRIST  Final   Special Requests   Final    BOTTLES DRAWN AEROBIC AND ANAEROBIC Blood Culture adequate volume   Culture   Final    NO GROWTH 5 DAYS Performed at [facility], [address] [place], [place] [address]    Report Status 04/10/2018 FINAL  Final  Blood Culture (routine x 2)     Status: Abnormal   Collection Time: Apr 15, 2018  5:17 PM  Result Value Ref Range Status   Specimen Description   Final    BLOOD LAC Performed at [facility], [address], [place], [place] [address]    Special Requests   Final    BOTTLES DRAWN AEROBIC AND ANAEROBIC Blood Culture adequate volume Performed at [facility], [address] [place], [place] [address]    Culture  Setup Time   Final    AEROBIC BOTTLE ONLY GRAM POSITIVE COCCI IN CLUSTERS CRITICAL RESULT CALLED TO, READ BACK BY AND VERIFIED WITH: CHRIS NESBITT AT 1540 ON 04/06/18 BY SNJ GRAM STAIN REVIEWED-AGREE WITH RESULT    Culture (A)  Final    STAPHYLOCOCCUS SPECIES (COAGULASE NEGATIVE) THE SIGNIFICANCE OF ISOLATING THIS ORGANISM FROM A SINGLE SET OF BLOOD CULTURES WHEN MULTIPLE SETS ARE DRAWN IS UNCERTAIN. PLEASE NOTIFY THE MICROBIOLOGY DEPARTMENT WITHIN ONE WEEK IF SPECIATION AND SENSITIVITIES ARE REQUIRED. Performed at [facility], [address] [place], [place] [address]    Report Status 04/08/2018 FINAL  Final  Blood Culture ID Panel (Reflexed)     Status: Abnormal   Collection Time: 04-15-18  5:17 PM  Result Value Ref Range Status   Enterococcus species NOT DETECTED NOT DETECTED Final   Listeria monocytogenes NOT DETECTED NOT DETECTED Final   Staphylococcus species DETECTED (A) NOT DETECTED Final    Comment: Methicillin (oxacillin) susceptible coagulase negative staphylococcus. Possible blood culture contaminant (unless isolated from more than one blood culture draw or  clinical case suggests pathogenicity). No antibiotic treatment is indicated for blood  culture contaminants. CRITICAL RESULT CALLED TO, READ BACK BY AND VERIFIED WITH: CHRIS NESBITT AT 1540 ON 04/06/18 BY SNJ    Staphylococcus aureus (BCID) NOT DETECTED NOT DETECTED Final   Methicillin resistance NOT DETECTED NOT DETECTED Final   Streptococcus species NOT DETECTED NOT DETECTED Final   Streptococcus agalactiae NOT DETECTED NOT DETECTED Final   Streptococcus pneumoniae NOT DETECTED NOT DETECTED Final   Streptococcus pyogenes NOT DETECTED NOT DETECTED Final   Acinetobacter  baumannii NOT DETECTED NOT DETECTED Final   Enterobacteriaceae species NOT DETECTED NOT DETECTED Final   Enterobacter cloacae complex NOT DETECTED NOT DETECTED Final   Escherichia coli NOT DETECTED NOT DETECTED Final   Klebsiella oxytoca NOT DETECTED NOT DETECTED Final   Klebsiella pneumoniae NOT DETECTED NOT DETECTED Final   Proteus species NOT DETECTED NOT DETECTED Final   Serratia marcescens NOT DETECTED NOT DETECTED Final   Haemophilus influenzae NOT DETECTED NOT DETECTED Final   Neisseria meningitidis NOT DETECTED NOT DETECTED Final   Pseudomonas aeruginosa NOT DETECTED NOT DETECTED Final   Candida albicans NOT DETECTED NOT DETECTED Final   Candida glabrata NOT DETECTED NOT DETECTED Final   Candida krusei NOT DETECTED NOT DETECTED Final   Candida parapsilosis NOT DETECTED NOT DETECTED Final   Candida tropicalis NOT DETECTED NOT DETECTED Final    Comment: Performed at [facility], [address] [place], [place] [address]  MRSA PCR Screening     Status: None   Collection Time: 04/06/18  3:25 PM  Result Value Ref Range Status   MRSA by PCR NEGATIVE NEGATIVE Final    Comment:        The GeneXpert MRSA Assay (FDA approved for NASAL specimens only), is one component of a comprehensive MRSA colonization surveillance program. It is not intended to diagnose MRSA infection nor to guide or monitor  treatment for MRSA infections. Performed at [facility], [address] [place], [place] [address]      Studies: US Renal  Result Date: 04/12/2018 CLINICAL DATA:  Acute renal failure EXAM: RENAL / URINARY TRACT ULTRASOUND COMPLETE COMPARISON:  None. FINDINGS: Right Kidney: Length: 9.9 cm. Echogenicity within normal limits. No mass or hydronephrosis visualized. Left Kidney: Length: 10.6 cm. Echogenicity within normal limits. No mass or hydronephrosis visualized. Bladder: Appears normal for degree of bladder distention. IMPRESSION: No acute abnormality noted. Electronically Signed   By: Alcide Clever M.D.   On: 04/12/2018 13:06   Dg Chest Port 1 View  Result Date: 04/11/2018 CLINICAL DATA:  SOB;   CHF, COPD, diabetes, CAD, EXAM: PORTABLE CHEST 1 VIEW COMPARISON:  04/08/2018 FINDINGS: Median sternotomy and CABG. The heart is enlarged. There is atherosclerotic calcification of the thoracic aorta. There is persistent opacity at the LEFT lung base, consistent atelectasis or infiltrate and pleural effusion. There is increasing opacity at the RIGHT lung base, consistent atelectasis and probable effusion. IMPRESSION: Stable cardiomegaly. Persistent atelectasis or infiltrate and effusion at the LEFT base. Increasing opacity at the RIGHT base. Electronically Signed   By: Norva Pavlov M.D.   On: 04/11/2018 11:54    Scheduled Meds: . aspirin EC  81 mg Oral Daily  . budesonide (PULMICORT) nebulizer solution  0.5 mg Nebulization BID  . cholecalciferol  1,000 Units Oral Daily  . diltiazem  180 mg Oral Daily  . docusate sodium  100 mg Oral BID  . finasteride  5 mg Oral Daily  . FLUoxetine  10 mg Oral Daily  . heparin injection (subcutaneous)  5,000 Units Subcutaneous Q8H  . insulin aspart  0-20 Units Subcutaneous TID WC  . ipratropium-albuterol  3 mL Nebulization Q6H  . metoprolol succinate  100 mg Oral Daily  . pravastatin  40 mg Oral Daily  . sodium chloride flush  3 mL Intravenous  Q12H   Continuous Infusions: . sodium chloride Stopped (04/10/18 1040)  . ampicillin-sulbactam (UNASYN) IV 3 g (04/12/18 2315)  . fluconazole (DIFLUCAN) IV 100 mg (04/12/18 1225)    Assessment/Plan:  1. Aspiration  pneumonia.  Speech therapy put on dysphasia diet.  Continue IV Unasyn.  Still high risk for aspiration events 2. Acute hypoxic hypercapnic respiratory failure.  Patient is off oxygen at this time. 3. Acute kidney injury with hyperkalemia.  Creatinine improved to 2.88 today.  Gentle with fluids.  Hyperkalemia improved. 4. Acute on chronic systolic and diastolic CHF with EF of 45 to 16%.  Holding off on ACE inhibitor with acute kidney injury.  5. Sepsis on presentation secondary to urinary tract infection that was treated.  Citrobacter grew out of the urine culture 6. Atrial fibrillation with rapid ventricular response.  Continue his medications 7. Essential hypertension.  8. BPH finasteride  9. Hyperlipidemia on Pravachol 10. Diabetic neuropathy.  Hold gabapentin with acute kidney injury 11. Hyperglycemia secondary to steroids.  On sliding scale only. 12. Weakness.  Physical therapy evaluation and out of bed to chair.  Code Status:     Code Status Orders  (From admission, onward)         Start     Ordered   04/24/2018 2151  Full code  Continuous     04/04/2018 2150        Code Status History    Date Active Date Inactive Code Status Order ID Comments User Context   02/22/2018 1443 02/24/2018 2003 Full Code [id number]  Shaune Pollack, MD ED     Family Communication: Son at the bedside Disposition Plan: Likely will end up needing rehab  Consultants:  Cardiology  Speech therapy  Antibiotics:  Unasyn  Time spent: 25 minutes.   Brittnei Jagiello [employer]

## 2018-04-13 NOTE — Progress Notes (Signed)
[facility] [place], [place] 04/13/18  Subjective:   Patient's grandson is at bedside.  He reports that patient is more alert.  He was able to eat some breakfast.  Serum creatinine is slightly lower at 2.88.  Urine output of 900 cc reported.  Patient continues to have cough  Objective:  Vital signs in last 24 hours:  Temp:  [97.6 F (36.4 C)-98.2 F (36.8 C)] 98 F (36.7 C) (10/13 0837) Pulse Rate:  [65-92] 90 (10/13 0906) Resp:  [16] 16 (10/12 1457) BP: (131-150)/(57-87) 150/80 (10/13 0837) SpO2:  [90 %-94 %] 91 % (10/13 0837) Weight:  [86.4 kg] 86.4 kg (10/13 0416)  Weight change: 0.091 kg Filed Weights   04/11/18 0513 04/12/18 0500 04/13/18 0416  Weight: 85.9 kg 86.3 kg 86.4 kg    Intake/Output:    Intake/Output Summary (Last 24 hours) at 04/13/2018 1302 Last data filed at 04/13/2018 1152 Gross per 24 hour  Intake -  Output 750 ml  Net -750 ml     Physical Exam: General:  No acute distress, laying in the bed  HEENT  anicteric, dry oral mucous membranes, decreased hearing  Neck  supple  Pulm/lungs  coarse breath sounds, crackles bilaterally at bases  CVS/Heart  irregular rhythm  Abdomen:   Soft, nontender, nondistended  Extremities:  No peripheral edema  Neurologic:  Alert, able to follow few simple commands  Skin:  Scattered bruises          Basic Metabolic Panel:  Recent Labs  Lab 04/10/18 0612 04/11/18 0305 04/12/18 0713 04/12/18 1422 04/13/18 0528  NA 135 134* 135 137 139  K 4.2 4.6 5.7* 4.4 4.6  CL 100 102 103 105 106  CO2 21* 21* 18* 23 24  GLUCOSE 290* 202* 196* 233* 185*  BUN 79* 100* 105* 105* 98*  CREATININE 2.59* 2.89* 3.47* 3.09* 2.88*  CALCIUM 8.3* 8.3* 8.1* 8.1* 8.1*     CBC: Recent Labs  Lab 04/08/18 0646 04/09/18 0511 04/10/18 0612 04/11/18 0305 04/12/18 0713  WBC 13.0* 7.7 15.8* 17.9* 14.4*  NEUTROABS  --   --  15.1*  --   --   HGB 11.4* 11.3* 10.8* 10.7* 11.1*  HCT 33.4* 34.5* 33.4* 32.5* 32.7*   MCV 94.7 96.1 97.7 96.4 94.5  PLT 213 237 302 327 214     No results found for: HEPBSAG, HEPBSAB, HEPBIGM    Microbiology:  Recent Results (from the past 240 hour(s))  Urine culture     Status: Abnormal   Collection Time: 04/21/2018  5:16 PM  Result Value Ref Range Status   Specimen Description   Final    URINE, RANDOM Performed at [facility], [address] [place], [place] [address]    Special Requests   Final    NONE Performed at [facility], [address] [place], [place] [address]    Culture >=100,000 COLONIES/mL CITROBACTER KOSERI (A)  Final   Report Status 04/08/2018 FINAL  Final   Organism ID, Bacteria CITROBACTER KOSERI (A)  Final      Susceptibility   Citrobacter koseri - MIC*    CEFAZOLIN <=4 SENSITIVE Sensitive     CEFTRIAXONE <=1 SENSITIVE Sensitive     CIPROFLOXACIN <=0.25 SENSITIVE Sensitive     GENTAMICIN <=1 SENSITIVE Sensitive     IMIPENEM <=0.25 SENSITIVE Sensitive     NITROFURANTOIN 32 SENSITIVE Sensitive     TRIMETH/SULFA <=20 SENSITIVE Sensitive     PIP/TAZO <=4 SENSITIVE Sensitive     * >=  100,000 COLONIES/mL CITROBACTER KOSERI  Blood Culture (routine x 2)     Status: None   Collection Time: Apr 11, 2018  5:17 PM  Result Value Ref Range Status   Specimen Description BLOOD  RT WRIST  Final   Special Requests   Final    BOTTLES DRAWN AEROBIC AND ANAEROBIC Blood Culture adequate volume   Culture   Final    NO GROWTH 5 DAYS Performed at [facility], [address], [place], [place] [address]    Report Status 04/10/2018 FINAL  Final  Blood Culture (routine x 2)     Status: Abnormal   Collection Time: 04-11-2018  5:17 PM  Result Value Ref Range Status   Specimen Description   Final    BLOOD LAC Performed at [facility], [address], [place], [place] [address]    Special Requests   Final    BOTTLES DRAWN AEROBIC AND ANAEROBIC Blood Culture adequate volume Performed at [facility], [address] [place], [place] [address]    Culture  Setup Time   Final    AEROBIC BOTTLE ONLY GRAM POSITIVE COCCI IN CLUSTERS CRITICAL RESULT CALLED TO, READ BACK BY AND VERIFIED WITH: CHRIS NESBITT AT 1540 ON 04/06/18 BY SNJ GRAM STAIN REVIEWED-AGREE WITH RESULT    Culture (A)  Final    STAPHYLOCOCCUS SPECIES (COAGULASE NEGATIVE) THE SIGNIFICANCE OF ISOLATING THIS ORGANISM FROM A SINGLE SET OF BLOOD CULTURES WHEN MULTIPLE SETS ARE DRAWN IS UNCERTAIN. PLEASE NOTIFY THE MICROBIOLOGY DEPARTMENT WITHIN ONE WEEK IF SPECIATION AND SENSITIVITIES ARE REQUIRED. Performed at [facility], [address] [place], [place] [address]    Report Status 04/08/2018 FINAL  Final  Blood Culture ID Panel (Reflexed)     Status: Abnormal   Collection Time: 04/11/2018  5:17 PM  Result Value Ref Range Status   Enterococcus species NOT DETECTED NOT DETECTED Final   Listeria monocytogenes NOT DETECTED NOT DETECTED Final   Staphylococcus species DETECTED (A) NOT DETECTED Final    Comment: Methicillin (oxacillin) susceptible coagulase negative staphylococcus. Possible blood culture contaminant (unless isolated from more than one blood culture draw or clinical case suggests pathogenicity). No antibiotic treatment is indicated for blood  culture contaminants. CRITICAL RESULT CALLED TO, READ BACK BY AND VERIFIED WITH: CHRIS NESBITT AT 1540 ON 04/06/18 BY SNJ    Staphylococcus aureus (BCID) NOT DETECTED NOT DETECTED Final   Methicillin resistance NOT DETECTED NOT DETECTED Final   Streptococcus species NOT DETECTED NOT DETECTED Final   Streptococcus agalactiae NOT DETECTED NOT DETECTED Final   Streptococcus pneumoniae NOT DETECTED NOT DETECTED Final   Streptococcus pyogenes NOT DETECTED NOT DETECTED Final   Acinetobacter baumannii NOT DETECTED NOT DETECTED Final   Enterobacteriaceae species NOT DETECTED NOT DETECTED Final   Enterobacter cloacae complex NOT DETECTED NOT DETECTED Final   Escherichia coli NOT  DETECTED NOT DETECTED Final   Klebsiella oxytoca NOT DETECTED NOT DETECTED Final   Klebsiella pneumoniae NOT DETECTED NOT DETECTED Final   Proteus species NOT DETECTED NOT DETECTED Final   Serratia marcescens NOT DETECTED NOT DETECTED Final   Haemophilus influenzae NOT DETECTED NOT DETECTED Final   Neisseria meningitidis NOT DETECTED NOT DETECTED Final   Pseudomonas aeruginosa NOT DETECTED NOT DETECTED Final   Candida albicans NOT DETECTED NOT DETECTED Final   Candida glabrata NOT DETECTED NOT DETECTED Final   Candida krusei NOT DETECTED NOT DETECTED Final   Candida parapsilosis NOT DETECTED NOT DETECTED Final   Candida tropicalis NOT DETECTED NOT DETECTED  Final    Comment: Performed at [facility], [address] [place], [place] [address]  MRSA PCR Screening     Status: None   Collection Time: 04/06/18  3:25 PM  Result Value Ref Range Status   MRSA by PCR NEGATIVE NEGATIVE Final    Comment:        The GeneXpert MRSA Assay (FDA approved for NASAL specimens only), is one component of a comprehensive MRSA colonization surveillance program. It is not intended to diagnose MRSA infection nor to guide or monitor treatment for MRSA infections. Performed at [facility], [address] [place], [place] [address]     Coagulation Studies: No results for input(s): LABPROT, INR in the last 72 hours.  Urinalysis: No results for input(s): COLORURINE, LABSPEC, PHURINE, GLUCOSEU, HGBUR, BILIRUBINUR, KETONESUR, PROTEINUR, UROBILINOGEN, NITRITE, LEUKOCYTESUR in the last 72 hours.  Invalid input(s): APPERANCEUR    Imaging: US Renal  Result Date: 04/12/2018 CLINICAL DATA:  Acute renal failure EXAM: RENAL / URINARY TRACT ULTRASOUND COMPLETE COMPARISON:  None. FINDINGS: Right Kidney: Length: 9.9 cm. Echogenicity within normal limits. No mass or hydronephrosis visualized. Left Kidney: Length: 10.6 cm. Echogenicity within normal limits. No mass or hydronephrosis  visualized. Bladder: Appears normal for degree of bladder distention. IMPRESSION: No acute abnormality noted. Electronically Signed   By: Alcide Clever M.D.   On: 04/12/2018 13:06     Medications:   . sodium chloride Stopped (04/10/18 1040)  . sodium chloride 30 mL/hr at 04/13/18 0915  . ampicillin-sulbactam (UNASYN) IV 3 g (04/13/18 0921)  . fluconazole (DIFLUCAN) IV 100 mg (04/13/18 1145)   . aspirin EC  81 mg Oral Daily  . budesonide (PULMICORT) nebulizer solution  0.5 mg Nebulization BID  . cholecalciferol  1,000 Units Oral Daily  . diltiazem  180 mg Oral Daily  . docusate sodium  100 mg Oral BID  . finasteride  5 mg Oral Daily  . FLUoxetine  10 mg Oral Daily  . heparin injection (subcutaneous)  5,000 Units Subcutaneous Q8H  . insulin aspart  0-20 Units Subcutaneous TID WC  . ipratropium-albuterol  3 mL Nebulization Q6H  . metoprolol succinate  100 mg Oral Daily  . pravastatin  40 mg Oral Daily  . sodium chloride flush  3 mL Intravenous Q12H   sodium chloride, acetaminophen **OR** acetaminophen, albuterol, diltiazem, labetalol, ondansetron **OR** ondansetron (ZOFRAN) IV, senna-docusate  Assessment/ Plan:  84 y.o. Caucasian male with medical problems of COPD, CHF, CAD, DM-2, HTN, atrial fibrillation who was admitted to [facility] on 12-Apr-2018 with generalized weakness.   1.  Acute renal failure 2.  Urinary tract infection, Citrobacter 3.  Hyperkalemia 4.  Altered mental status 5.  Aspiration pneumonia  Patient has baseline fragile health and presently critically ill with multisystem involvement.  He has a urinary tract infection with Citrobacter.  Atrial fibrillation with hemodynamic instability.  Worsening renal failure with hyperkalemia.  He has developed altered mental status and confusion likely due to underlying illnesses. There are concerns about aspiration pneumonia. Renal ultrasound is negative for obstruction Clinically improved today.  Serum creatinine slightly  lower Continue supportive care.   LOS: 8 Vennie Salsbury 10/13/20191:02 PM  [facility] [place], [place] [phone]  Note: This note was prepared with Dragon dictation. Any transcription errors are unintentional

## 2018-04-14 ENCOUNTER — Inpatient Hospital Stay: Payer: Medicare Other

## 2018-04-14 DIAGNOSIS — J9602 Acute respiratory failure with hypercapnia: Secondary | ICD-10-CM

## 2018-04-14 DIAGNOSIS — J690 Pneumonitis due to inhalation of food and vomit: Secondary | ICD-10-CM

## 2018-04-14 DIAGNOSIS — I4819 Other persistent atrial fibrillation: Secondary | ICD-10-CM

## 2018-04-14 DIAGNOSIS — N179 Acute kidney failure, unspecified: Secondary | ICD-10-CM

## 2018-04-14 DIAGNOSIS — N10 Acute tubulo-interstitial nephritis: Secondary | ICD-10-CM

## 2018-04-14 LAB — BASIC METABOLIC PANEL
ANION GAP: 10 (ref 5–15)
BUN: 90 mg/dL — ABNORMAL HIGH (ref 8–23)
CALCIUM: 7.8 mg/dL — AB (ref 8.9–10.3)
CO2: 22 mmol/L (ref 22–32)
Chloride: 109 mmol/L (ref 98–111)
Creatinine, Ser: 2.34 mg/dL — ABNORMAL HIGH (ref 0.61–1.24)
GFR calc non Af Amer: 23 mL/min — ABNORMAL LOW (ref >60)
GFR, EST AFRICAN AMERICAN: 27 mL/min — AB (ref >60)
Glucose, Bld: 219 mg/dL — ABNORMAL HIGH (ref 70–99)
Potassium: 5 mmol/L (ref 3.5–5.1)
SODIUM: 141 mmol/L (ref 135–145)

## 2018-04-14 LAB — PROCALCITONIN: Procalcitonin: 0.62 ng/mL

## 2018-04-14 LAB — CBC WITH DIFFERENTIAL/PLATELET
Abs Immature Granulocytes: 0.52 10*3/uL — ABNORMAL HIGH (ref 0.00–0.07)
BASOS ABS: 0 10*3/uL (ref 0.0–0.1)
Basophils Relative: 0 %
EOS ABS: 0 10*3/uL (ref 0.0–0.5)
Eosinophils Relative: 0 %
HEMATOCRIT: 35.5 % — AB (ref 39.0–52.0)
HEMOGLOBIN: 11.3 g/dL — AB (ref 13.0–17.0)
IMMATURE GRANULOCYTES: 5 %
LYMPHS ABS: 0.4 10*3/uL — AB (ref 0.7–4.0)
LYMPHS PCT: 3 %
MCH: 31.3 pg (ref 26.0–34.0)
MCHC: 31.8 g/dL (ref 30.0–36.0)
MCV: 98.3 fL (ref 80.0–100.0)
Monocytes Absolute: 0.7 10*3/uL (ref 0.1–1.0)
Monocytes Relative: 6 %
NEUTROS PCT: 86 %
NRBC: 0 % (ref 0.0–0.2)
Neutro Abs: 9 10*3/uL — ABNORMAL HIGH (ref 1.7–7.7)
Platelets: 309 10*3/uL (ref 150–400)
RBC: 3.61 MIL/uL — AB (ref 4.22–5.81)
RDW: 13.2 % (ref 11.5–15.5)
WBC: 10.6 10*3/uL — ABNORMAL HIGH (ref 4.0–10.5)

## 2018-04-14 LAB — LACTIC ACID, PLASMA: Lactic Acid, Venous: 1.1 mmol/L (ref 0.5–1.9)

## 2018-04-14 LAB — BLOOD GAS, ARTERIAL
Acid-base deficit: 6.2 mmol/L — ABNORMAL HIGH (ref 0.0–2.0)
Acid-base deficit: 6 mmol/L — ABNORMAL HIGH (ref 0.0–2.0)
BICARBONATE: 22.9 mmol/L (ref 20.0–28.0)
Bicarbonate: 22.8 mmol/L (ref 20.0–28.0)
Delivery systems: POSITIVE
Expiratory PAP: 8
FIO2: 1
FIO2: 0.5
Inspiratory PAP: 16
O2 SAT: 98.7 %
O2 Saturation: 99.8 %
PATIENT TEMPERATURE: 37
PCO2 ART: 61 mmHg — AB (ref 32.0–48.0)
PH ART: 7.18 — AB (ref 7.350–7.450)
PO2 ART: 145 mmHg — AB (ref 83.0–108.0)
Patient temperature: 37
pCO2 arterial: 60 mmHg — ABNORMAL HIGH (ref 32.0–48.0)
pH, Arterial: 7.19 — CL (ref 7.350–7.450)
pO2, Arterial: 277 mmHg — ABNORMAL HIGH (ref 83.0–108.0)

## 2018-04-14 LAB — GLUCOSE, CAPILLARY
GLUCOSE-CAPILLARY: 174 mg/dL — AB (ref 70–99)
GLUCOSE-CAPILLARY: 184 mg/dL — AB (ref 70–99)
GLUCOSE-CAPILLARY: 232 mg/dL — AB (ref 70–99)
Glucose-Capillary: 166 mg/dL — ABNORMAL HIGH (ref 70–99)
Glucose-Capillary: 186 mg/dL — ABNORMAL HIGH (ref 70–99)
Glucose-Capillary: 287 mg/dL — ABNORMAL HIGH (ref 70–99)

## 2018-04-14 MED ORDER — NYSTATIN 100000 UNIT/ML MT SUSP
5.0000 mL | Freq: Four times a day (QID) | OROMUCOSAL | Status: DC
  Administered 2018-04-14: 500000 [IU] via OROMUCOSAL
  Filled 2018-04-14 (×4): qty 5

## 2018-04-14 MED ORDER — INSULIN ASPART 100 UNIT/ML ~~LOC~~ SOLN
0.0000 [IU] | Freq: Three times a day (TID) | SUBCUTANEOUS | Status: DC
  Administered 2018-04-14: 5 [IU] via SUBCUTANEOUS
  Administered 2018-04-14: 3 [IU] via SUBCUTANEOUS
  Administered 2018-04-15 (×2): 2 [IU] via SUBCUTANEOUS
  Filled 2018-04-14 (×4): qty 1

## 2018-04-14 MED ORDER — QUETIAPINE FUMARATE 25 MG PO TABS
12.5000 mg | ORAL_TABLET | Freq: Every day | ORAL | Status: DC
  Administered 2018-04-14: 23:00:00 12.5 mg via ORAL
  Filled 2018-04-14: qty 1

## 2018-04-14 MED ORDER — HALOPERIDOL LACTATE 5 MG/ML IJ SOLN
1.0000 mg | Freq: Four times a day (QID) | INTRAMUSCULAR | Status: DC | PRN
  Administered 2018-04-15 (×2): 1 mg via INTRAVENOUS
  Filled 2018-04-14 (×2): qty 1

## 2018-04-14 MED ORDER — INSULIN ASPART 100 UNIT/ML ~~LOC~~ SOLN: 0.0000 [IU] | Freq: Every day | SUBCUTANEOUS | Status: DC

## 2018-04-14 MED ORDER — INSULIN ASPART 100 UNIT/ML ~~LOC~~ SOLN: 0.0000 [IU] | Freq: Three times a day (TID) | SUBCUTANEOUS | Status: DC

## 2018-04-14 MED ORDER — NYSTATIN 100000 UNIT/ML MT SUSP
5.0000 mL | Freq: Four times a day (QID) | OROMUCOSAL | Status: DC
  Administered 2018-04-15 (×2): 500000 [IU] via ORAL
  Filled 2018-04-14: qty 5

## 2018-04-14 MED ORDER — FLUCONAZOLE 100MG IVPB
100.0000 mg | Freq: Every day | INTRAVENOUS | Status: DC
  Administered 2018-04-14: 16:00:00 100 mg via INTRAVENOUS
  Filled 2018-04-14 (×2): qty 50

## 2018-04-14 MED ORDER — SODIUM BICARBONATE 8.4 % IV SOLN
100.0000 meq | Freq: Once | INTRAVENOUS | Status: AC
  Administered 2018-04-14: 100 meq via INTRAVENOUS
  Filled 2018-04-14: qty 100

## 2018-04-14 NOTE — Progress Notes (Signed)
Initial Nutrition Assessment  DOCUMENTATION CODES:   Not applicable  INTERVENTION:  Monitor for diet recs from SLP [facility] if pt returns to [place] thick   NUTRITION DIAGNOSIS:   Inadequate oral intake related to dysphagia, acute illness(respiratory failure secondary to aspiration pneumonia) as evidenced by NPO status, energy intake < 75% for > 7 days.   GOAL:   Patient will meet greater than or equal to 90% of their needs   MONITOR:   Diet advancement  REASON FOR ASSESSMENT:   Malnutrition Screening Tool    ASSESSMENT:   87 to pt admitted 10/5 w/ UTI, acute encephalopathy, and hypercapnic respiratory failure secondary to aspiration pneumonia. PMH: CAD, CHF, COPD, T2DM, HLD, HTN, Hearing loss, Macular degeneration, CABG, Cholecystectomy  Pt w/ returned to floor after 10/14 ICU admit for respiratory failure requiring Bipap. Pt was awake and unsettled/agitated in the bed, but not engaging in conversation, son is at bedside. Son lives in [place] and reported pt lives w/ another son and grandson. Pt has been receiving Meals On Wheels since 08/19 and son was unsure of how many additional meals pt would consume outside of foods provided by service.   Pt seen by SLP this afternoon and recommends further MBSS to determine appropriate diet. Pt w/ mod to severe oral prep/coordination deficits during PO trials.   NUTRITION - FOCUSED PHYSICAL EXAM:    Most Recent Value  Orbital Region  Moderate depletion  Upper Arm Region  Unable to assess  Thoracic and Lumbar Region  Unable to assess  Buccal Region  Mild depletion  Temple Region  Moderate depletion  Clavicle Bone Region  Unable to assess  Clavicle and Acromion Bone Region  Unable to assess  Scapular Bone Region  Unable to assess  Dorsal Hand  Unable to assess  Patellar Region  Unable to assess  Anterior Thigh Region  Unable to assess  Posterior Calf Region  Unable to assess  Edema (RD Assessment)  Mild  [BLE,  mild pitting]  Hair  Reviewed  Eyes  Reviewed  Mouth  Reviewed Cramer.Melena ]  Skin  Reviewed  Nails  Reviewed       Diet Order:  52.5% x 4 recorded meals  Diet Order            Diet NPO time specified Except for: Other (See Comments)  Diet effective now            10/5: NPO -  HH/CHO mod 10/11: NPO 10/12: DYS 1 10/14: NPO  EDUCATION NEEDS:   No education needs have been identified at this time  Skin:  Skin Assessment: Reviewed RN Assessment(blister; rt LL, moisture; BL buttocks; skin tear; rt elbow)  Last BM:  10/13: Type 6, Type 7(10/12: Laxative given)  Height:   Ht Readings from Last 1 Encounters:  04/14/18 6\' 2"  (1.88 m)    Weight:   Wt Readings from Last 1 Encounters:  04/14/18 86.1 kg    Ideal Body Weight:  86.4 kg  BMI:  Body mass index is 24.37 kg/m.  Estimated Nutritional Needs:   Kcal:  1950-2100  Protein:  94-115  Fluid:  1.9L    Lars Masson, RD, LDN  After Hours/Weekend Pager: [phone]

## 2018-04-14 NOTE — Progress Notes (Addendum)
Progress Note  Patient Name: Fred Watson Date of Encounter: 04/14/2018  Primary Cardiologist: Lorine Bears, MD  Subjective   Son @ bedside.  Pt very HOH.  Does not answer questions or cooperate w/ exam.  Son notes that pt seems more disoriented than usual - pulling @ lines, etc  Inpatient Medications    Scheduled Meds: . aspirin EC  81 mg Oral Daily  . budesonide (PULMICORT) nebulizer solution  0.5 mg Nebulization BID  . cholecalciferol  1,000 Units Oral Daily  . diltiazem  180 mg Oral Daily  . docusate sodium  100 mg Oral BID  . finasteride  5 mg Oral Daily  . FLUoxetine  10 mg Oral Daily  . heparin injection (subcutaneous)  5,000 Units Subcutaneous Q8H  . insulin aspart  0-20 Units Subcutaneous TID WC  . ipratropium-albuterol  3 mL Nebulization Q6H  . metoprolol succinate  100 mg Oral Daily  . pravastatin  40 mg Oral Daily  . sodium chloride flush  3 mL Intravenous Q12H   Continuous Infusions: . sodium chloride Stopped (04/10/18 1040)  . ampicillin-sulbactam (UNASYN) IV 3 g (04/13/18 2323)  . fluconazole (DIFLUCAN) IV Stopped (04/13/18 1245)   PRN Meds: sodium chloride, acetaminophen **OR** acetaminophen, albuterol, diltiazem, labetalol, ondansetron **OR** ondansetron (ZOFRAN) IV, senna-docusate, traZODone   Vital Signs    Vitals:   04/14/18 0300 04/14/18 0400 04/14/18 0433 04/14/18 0500  BP: (!) 117/59 (!) 128/55  125/63  Pulse: 83 82    Resp: 16 20  (!) 25  Temp:  98.2 F (36.8 C)    TempSrc:  Axillary    SpO2: 100% 100% 99%   Weight:      Height:        Intake/Output Summary (Last 24 hours) at 04/14/2018 0811 Last data filed at 04/14/2018 0700 Gross per 24 hour  Intake 623.52 ml  Output 1075 ml  Net -451.48 ml   Filed Weights   04/12/18 0500 04/13/18 0416 04/14/18 0015  Weight: 86.3 kg 86.4 kg 87.9 kg    Physical Exam   GEN: Well nourished, well developed, in no acute distress.  HEENT: Very HOH. Neck: Supple, no JVD, carotid bruits, or  masses. Cardiac: IR, IR - distant, no murmurs, rubs, or gallops. No clubbing, cyanosis, edema.   Respiratory:  Respirations regular and unlabored, diminished breath sounds bilat w/ bibasilar crackles. GI: Soft, nontender, nondistended, BS + x 4. MS: no deformity or atrophy. Skin: warm and dry, no rash. Neuro:  Strength and sensation are intact. Psych: Does not respond to orientation questions or cooperate w/ exam.  Labs    Chemistry Recent Labs  Lab 04/12/18 1422 04/13/18 0528 04/14/18 0107  NA 137 139 141  K 4.4 4.6 5.0  CL 105 106 109  CO2 23 24 22   GLUCOSE 233* 185* 219*  BUN 105* 98* 90*  CREATININE 3.09* 2.88* 2.34*  CALCIUM 8.1* 8.1* 7.8*  GFRNONAA 16* 18* 23*  GFRAA 19* 21* 27*  ANIONGAP 9 9 10      Hematology Recent Labs  Lab 04/11/18 0305 04/12/18 0713 04/14/18 0107  WBC 17.9* 14.4* 10.6*  RBC 3.37* 3.46* 3.61*  HGB 10.7* 11.1* 11.3*  HCT 32.5* 32.7* 35.5*  MCV 96.4 94.5 98.3  MCH 31.8 32.1 31.3  MCHC 32.9 33.9 31.8  RDW 12.8 13.1 13.2  PLT 327 214 309     Radiology    Dg Chest 1 View  Result Date: 04/13/2018 CLINICAL DATA:  Patient with cough and wheeze EXAM: CHEST  1 VIEW COMPARISON:  Chest radiograph 04/11/2018 FINDINGS: Stable enlarged cardiac and mediastinal contours. Interval increase in patchy consolidation within the left and right mid lung. Moderate left pleural effusion. No pneumothorax. IMPRESSION: Increasing consolidation within the left mid lung and right mid lung which may represent infection and/or edema. Cardiomegaly. Moderate left pleural effusion. Electronically Signed   By: Annia Belt M.D.   On: 04/13/2018 21:11   Ct Head Wo Contrast  Result Date: 04/14/2018 CLINICAL DATA:  Altered mental status. Patient was found unresponsive. EXAM: CT HEAD WITHOUT CONTRAST TECHNIQUE: Contiguous axial images were obtained from the base of the skull through the vertex without intravenous contrast. COMPARISON:  None. FINDINGS: Brain: Diffuse  cerebral atrophy. Ventricular dilatation consistent with central atrophy. Low-attenuation changes in the deep white matter consistent with small vessel ischemia. No mass effect or midline shift. No abnormal extra-axial fluid collections. Gray-white matter junctions are distinct. Basal cisterns are not effaced. No acute intracranial hemorrhage. Vascular: Moderate intracranial arterial vascular calcifications are present. Skull: Calvarium appears intact. Sinuses/Orbits: Paranasal sinuses are clear. Diffuse opacification of the left mastoid air cells and left middle ear. Other: None. IMPRESSION: No acute intracranial abnormalities. Chronic atrophy and small vessel ischemic changes. Left mastoid effusions. Fluid in the left middle ear may indicate otomastoiditis. Electronically Signed   By: Burman Nieves M.D.   On: 04/14/2018 00:51   US Renal  Result Date: 04/12/2018 CLINICAL DATA:  Acute renal failure EXAM: RENAL / URINARY TRACT ULTRASOUND COMPLETE COMPARISON:  None. FINDINGS: Right Kidney: Length: 9.9 cm. Echogenicity within normal limits. No mass or hydronephrosis visualized. Left Kidney: Length: 10.6 cm. Echogenicity within normal limits. No mass or hydronephrosis visualized. Bladder: Appears normal for degree of bladder distention. IMPRESSION: No acute abnormality noted. Electronically Signed   By: Alcide Clever M.D.   On: 04/12/2018 13:06   Dg Chest Port 1 View  Result Date: 04/14/2018 CLINICAL DATA:  84 year old male with acute respiratory failure. Subsequent encounter. EXAM: PORTABLE CHEST 1 VIEW COMPARISON:  04/13/2018 chest x-ray. FINDINGS: Cardiomegaly post CABG. Decrease in degree of pulmonary hilar and basilar consolidation. This may represent improving pulmonary edema. Infectious infiltrate particularly right perihilar region and left base not excluded. Left-sided pleural effusion may be present. Biapical pleural thickening without associated bony destruction. No pneumothorax. Calcified aorta.  No acute osseous abnormality noted. Fusion thoracic spine. IMPRESSION: 1. Decrease in degree of pulmonary hilar and basilar consolidation. This may represent improving pulmonary edema. Infectious infiltrate particularly right perihilar region and left base not excluded. Left-sided pleural effusion may be present. 2. Cardiomegaly post CABG 3.  Aortic Atherosclerosis (ICD10-I70.0). Electronically Signed   By: Lacy Duverney M.D.   On: 04/14/2018 07:43    Telemetry    Afib, mostly 70's to 80's. Six beats of NSVT. - Personally Reviewed   Cardiac Studies   2D Echocardiogram 10.8.2019  Study Conclusions   - Left ventricle: Systolic function was mildly reduced. The   estimated ejection fraction was in the range of 45% to 50%. - Aortic valve: There was mild regurgitation. - Left atrium: The atrium was mildly dilated.   Patient Profile     84 y.o. 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   Assessment & Plan  1.  Afib w/ RVR: New onset in the setting of resp failure/PNA/sepsis.  Remains in Afib but well rate-controlled - mostly 70's to 80's on PO dilt and  blocker.  No OAC 2/2 frailty, falls, and worsening renal fxn.  No new recs today.  2.  CAD s/p CABG:  No c/p.  Trop nl x 1.  Cont asa,  blocker, statin.  3.  Chronic combined syst/diast CHF:  Euvolemic.  HR/BP stable.  Cont  blocker.  No acei/arb/arni/mra in setting of renal failure.  4.  Acute on CKD III:  Renal fxn slightly beter.   Nephrology on board.  5.  Essential HTN:  Stable.  6.  Sepsis/PNA/UTI:  abx per IM. Signed, Nicolasa Ducking, NP  04/14/2018, 8:11 AM    [facility] will sign off.   Medication Recommendations:  Cont  blocker, dilt @  current doses. Follow up as an outpatient:  Following full recovery.  For questions or updates, please contact   Please consult [email or web address] for contact info under Cardiology/STEMI.

## 2018-04-14 NOTE — Progress Notes (Signed)
Pt at this time becoming unresponsive. Not responding to sternal rub.  Oxygen sats in 80"s,Star placed with no response. Nonrebreather placed, sats better, but not responding. Rapid response called.

## 2018-04-14 NOTE — Progress Notes (Signed)
Notified by RN pts son Billie Intriago wants to change pts code status to DNR due decline in pts condition.  However, I spoke with Clovis Cao and he informed me he does have a brother his name is Ramon Zanders and they have not established a HCPOA regarding his father.  I informed him his brother Brett Canales has to be in agreement with changing pts code status to DNR.  He stated his brother will be arriving at the hospital today and will likely agree with changing pts code status.  I have consulted palliative care to discuss goals of treatment and code status.  Will continue to monitor and assess pt.  Sonda Rumble, AGNP  Pulmonary/Critical Care Pager [phone] (please enter 7 digits) PCCM Consult Pager [phone] (please enter 7 digits)

## 2018-04-14 NOTE — Progress Notes (Signed)
PT Cancellation Note  Patient Details Name: FED CECI MRN: [id number] DOB: [birth year]   Cancelled Treatment:    Reason Eval/Treat Not Completed: Medical issues which prohibited therapy(Patient noted with repeat transfer to CCU due to unresponsive episode, worsening respiratory status (requiring BiPAP for respiratory support).  Will require new orders to resume PT services when medically appropriate.)   Keymiah Lyles H. Manson Passey, PT, DPT, NCS 04/14/18, 10:49 AM [phone]

## 2018-04-14 NOTE — Progress Notes (Signed)
No charge note:   Consult received, chart reviewed- Patient's son, [name] was at bedside. Arranged GOC meeting for tomorrow 10/15 at 1400 with both [name] and Brett Canales to be present.   Ocie Bob, AGNP-C Palliative Medicine  Please call Palliative Medicine team phone with any questions [phone]. For individual providers please see AMION.

## 2018-04-14 NOTE — Progress Notes (Signed)
Patient ID: Fred Watson, male   DOB: 1927-05-20, 84 y.o.   MRN: [id number]   [employer] [facility] PROGRESS NOTE  JAMAURY GUMZ WUJ:[id number] DOB: [birth year] DOA: 04/15/2018 PCP: Mickey Farber, MD  HPI/Subjective: Patient is agitated and unable to get any history from him today.  Son at the bedside.  Objective: Vitals:   04/14/18 1100 04/14/18 1222  BP: (!) 152/72 (!) 147/70  Pulse: 96 88  Resp: (!) 29 18  Temp:  (!) 97.5 F (36.4 C)  SpO2: 94% 91%    Filed Weights   04/13/18 0416 04/14/18 0015 04/14/18 1222  Weight: 86.4 kg 87.9 kg 86.1 kg    ROS: Review of Systems  Unable to perform ROS: Acuity of condition   Exam: Physical Exam  HENT:  Nose: No mucosal edema.  Mouth/Throat: No oropharyngeal exudate or posterior oropharyngeal edema.  Eyes: Pupils are equal, round, and reactive to light. Conjunctivae and lids are normal.  Neck: No JVD present. Carotid bruit is not present. No edema present. No thyroid mass and no thyromegaly present.  Cardiovascular: S1 normal and S2 normal. An irregularly irregular rhythm present. Exam reveals no gallop.  No murmur heard. Pulses:      Dorsalis pedis pulses are 2+ on the right side, and 2+ on the left side.  Respiratory: No respiratory distress. He has decreased breath sounds in the right lower field and the left lower field. He has wheezes in the right middle field and the left middle field. He has rhonchi in the right lower field and the left lower field. He has no rales.  GI: Soft. Bowel sounds are normal. There is no tenderness.  Musculoskeletal:       Right ankle: He exhibits no swelling.       Left ankle: He exhibits no swelling.  Lymphadenopathy:    He has no cervical adenopathy.  Neurological: He is alert. No cranial nerve deficit.  Skin: Skin is warm. No rash noted. Nails show no clubbing.  Psychiatric: He has a normal mood and affect.      Data Reviewed: Basic Metabolic Panel: Recent Labs  Lab 04/11/18 0305  04/12/18 0713 04/12/18 1422 04/13/18 0528 04/14/18 0107  NA 134* 135 137 139 141  K 4.6 5.7* 4.4 4.6 5.0  CL 102 103 105 106 109  CO2 21* 18* 23 24 22   GLUCOSE 202* 196* 233* 185* 219*  BUN 100* 105* 105* 98* 90*  CREATININE 2.89* 3.47* 3.09* 2.88* 2.34*  CALCIUM 8.3* 8.1* 8.1* 8.1* 7.8*   CBC: Recent Labs  Lab 04/09/18 0511 04/10/18 0612 04/11/18 0305 04/12/18 0713 04/14/18 0107  WBC 7.7 15.8* 17.9* 14.4* 10.6*  NEUTROABS  --  15.1*  --   --  9.0*  HGB 11.3* 10.8* 10.7* 11.1* 11.3*  HCT 34.5* 33.4* 32.5* 32.7* 35.5*  MCV 96.1 97.7 96.4 94.5 98.3  PLT 237 302 327 214 309    CBG: Recent Labs  Lab 04/13/18 2126 04/13/18 2339 04/14/18 0053 04/14/18 0714 04/14/18 1231  GLUCAP 186* 184* 186* 174* 287*    Recent Results (from the past 240 hour(s))  Urine culture     Status: Abnormal   Collection Time: 04/26/2018  5:16 PM  Result Value Ref Range Status   Specimen Description   Final    URINE, RANDOM Performed at [facility], [address] [place], [place] [address]    Special Requests   Final    NONE Performed at [facility], [address] [place],  [place] [address]    Culture >=100,000 COLONIES/mL CITROBACTER KOSERI (A)  Final   Report Status 04/08/2018 FINAL  Final   Organism ID, Bacteria CITROBACTER KOSERI (A)  Final      Susceptibility   Citrobacter koseri - MIC*    CEFAZOLIN <=4 SENSITIVE Sensitive     CEFTRIAXONE <=1 SENSITIVE Sensitive     CIPROFLOXACIN <=0.25 SENSITIVE Sensitive     GENTAMICIN <=1 SENSITIVE Sensitive     IMIPENEM <=0.25 SENSITIVE Sensitive     NITROFURANTOIN 32 SENSITIVE Sensitive     TRIMETH/SULFA <=20 SENSITIVE Sensitive     PIP/TAZO <=4 SENSITIVE Sensitive     * >=100,000 COLONIES/mL CITROBACTER KOSERI  Blood Culture (routine x 2)     Status: None   Collection Time: 04/26/2018  5:17 PM  Result Value Ref Range Status   Specimen Description BLOOD  RT WRIST  Final   Special Requests   Final    BOTTLES  DRAWN AEROBIC AND ANAEROBIC Blood Culture adequate volume   Culture   Final    NO GROWTH 5 DAYS Performed at [facility], [address], [place], [place] [address]    Report Status 04/10/2018 FINAL  Final  Blood Culture (routine x 2)     Status: Abnormal   Collection Time: 04/14/2018  5:17 PM  Result Value Ref Range Status   Specimen Description   Final    BLOOD LAC Performed at [facility], [address] [place], [place] [address]    Special Requests   Final    BOTTLES DRAWN AEROBIC AND ANAEROBIC Blood Culture adequate volume Performed at [facility], [address] [place], [place] [address]    Culture  Setup Time   Final    AEROBIC BOTTLE ONLY GRAM POSITIVE COCCI IN CLUSTERS CRITICAL RESULT CALLED TO, READ BACK BY AND VERIFIED WITH: CHRIS NESBITT AT 1540 ON 04/06/18 BY SNJ GRAM STAIN REVIEWED-AGREE WITH RESULT    Culture (A)  Final    STAPHYLOCOCCUS SPECIES (COAGULASE NEGATIVE) THE SIGNIFICANCE OF ISOLATING THIS ORGANISM FROM A SINGLE SET OF BLOOD CULTURES WHEN MULTIPLE SETS ARE DRAWN IS UNCERTAIN. PLEASE NOTIFY THE MICROBIOLOGY DEPARTMENT WITHIN ONE WEEK IF SPECIATION AND SENSITIVITIES ARE REQUIRED. Performed at [facility], [address], [place], [place] [address]    Report Status 04/08/2018 FINAL  Final  Blood Culture ID Panel (Reflexed)     Status: Abnormal   Collection Time: 04/29/2018  5:17 PM  Result Value Ref Range Status   Enterococcus species NOT DETECTED NOT DETECTED Final   Listeria monocytogenes NOT DETECTED NOT DETECTED Final   Staphylococcus species DETECTED (A) NOT DETECTED Final    Comment: Methicillin (oxacillin) susceptible coagulase negative staphylococcus. Possible blood culture contaminant (unless isolated from more than one blood culture draw or clinical case suggests pathogenicity). No antibiotic treatment is indicated for blood  culture contaminants. CRITICAL RESULT CALLED TO, READ BACK BY AND VERIFIED  WITH: CHRIS NESBITT AT 1540 ON 04/06/18 BY SNJ    Staphylococcus aureus (BCID) NOT DETECTED NOT DETECTED Final   Methicillin resistance NOT DETECTED NOT DETECTED Final   Streptococcus species NOT DETECTED NOT DETECTED Final   Streptococcus agalactiae NOT DETECTED NOT DETECTED Final   Streptococcus pneumoniae NOT DETECTED NOT DETECTED Final   Streptococcus pyogenes NOT DETECTED NOT DETECTED Final   Acinetobacter baumannii NOT DETECTED NOT DETECTED Final   Enterobacteriaceae species NOT DETECTED NOT DETECTED Final   Enterobacter cloacae complex NOT DETECTED NOT DETECTED Final   Escherichia coli NOT  DETECTED NOT DETECTED Final   Klebsiella oxytoca NOT DETECTED NOT DETECTED Final   Klebsiella pneumoniae NOT DETECTED NOT DETECTED Final   Proteus species NOT DETECTED NOT DETECTED Final   Serratia marcescens NOT DETECTED NOT DETECTED Final   Haemophilus influenzae NOT DETECTED NOT DETECTED Final   Neisseria meningitidis NOT DETECTED NOT DETECTED Final   Pseudomonas aeruginosa NOT DETECTED NOT DETECTED Final   Candida albicans NOT DETECTED NOT DETECTED Final   Candida glabrata NOT DETECTED NOT DETECTED Final   Candida krusei NOT DETECTED NOT DETECTED Final   Candida parapsilosis NOT DETECTED NOT DETECTED Final   Candida tropicalis NOT DETECTED NOT DETECTED Final    Comment: Performed at [facility], [address] [place], [place] [address]  MRSA PCR Screening     Status: None   Collection Time: 04/06/18  3:25 PM  Result Value Ref Range Status   MRSA by PCR NEGATIVE NEGATIVE Final    Comment:        The GeneXpert MRSA Assay (FDA approved for NASAL specimens only), is one component of a comprehensive MRSA colonization surveillance program. It is not intended to diagnose MRSA infection nor to guide or monitor treatment for MRSA infections. Performed at [facility], [address] [place], [place] [address]   CULTURE, BLOOD (ROUTINE X 2) w Reflex to ID Panel      Status: None (Preliminary result)   Collection Time: 04/14/18  1:19 AM  Result Value Ref Range Status   Specimen Description BLOOD RIGHT HAND  Final   Special Requests   Final    BOTTLES DRAWN AEROBIC AND ANAEROBIC Blood Culture adequate volume   Culture   Final    NO GROWTH < 12 HOURS Performed at [facility], [address] [place], [place] [address]    Report Status PENDING  Incomplete  CULTURE, BLOOD (ROUTINE X 2) w Reflex to ID Panel     Status: None (Preliminary result)   Collection Time: 04/14/18  1:43 AM  Result Value Ref Range Status   Specimen Description BLOOD LEFT HAND  Final   Special Requests   Final    BOTTLES DRAWN AEROBIC AND ANAEROBIC Blood Culture adequate volume   Culture   Final    NO GROWTH < 12 HOURS Performed at [facility], [address] [place], [place] [address]    Report Status PENDING  Incomplete     Studies: Dg Chest 1 View  Result Date: 04/13/2018 CLINICAL DATA:  Patient with cough and wheeze EXAM: CHEST  1 VIEW COMPARISON:  Chest radiograph 04/11/2018 FINDINGS: Stable enlarged cardiac and mediastinal contours. Interval increase in patchy consolidation within the left and right mid lung. Moderate left pleural effusion. No pneumothorax. IMPRESSION: Increasing consolidation within the left mid lung and right mid lung which may represent infection and/or edema. Cardiomegaly. Moderate left pleural effusion. Electronically Signed   By: Annia Belt M.D.   On: 04/13/2018 21:11   Ct Head Wo Contrast  Result Date: 04/14/2018 CLINICAL DATA:  Altered mental status. Patient was found unresponsive. EXAM: CT HEAD WITHOUT CONTRAST TECHNIQUE: Contiguous axial images were obtained from the base of the skull through the vertex without intravenous contrast. COMPARISON:  None. FINDINGS: Brain: Diffuse cerebral atrophy. Ventricular dilatation consistent with central atrophy. Low-attenuation changes in the deep white matter consistent with small  vessel ischemia. No mass effect or midline shift. No abnormal extra-axial fluid collections. Gray-white matter junctions are distinct. Basal cisterns are not effaced. No acute intracranial hemorrhage.  Vascular: Moderate intracranial arterial vascular calcifications are present. Skull: Calvarium appears intact. Sinuses/Orbits: Paranasal sinuses are clear. Diffuse opacification of the left mastoid air cells and left middle ear. Other: None. IMPRESSION: No acute intracranial abnormalities. Chronic atrophy and small vessel ischemic changes. Left mastoid effusions. Fluid in the left middle ear may indicate otomastoiditis. Electronically Signed   By: Burman Nieves M.D.   On: 04/14/2018 00:51   Dg Chest Port 1 View  Result Date: 04/14/2018 CLINICAL DATA:  84 year old male with acute respiratory failure. Subsequent encounter. EXAM: PORTABLE CHEST 1 VIEW COMPARISON:  04/13/2018 chest x-ray. FINDINGS: Cardiomegaly post CABG. Decrease in degree of pulmonary hilar and basilar consolidation. This may represent improving pulmonary edema. Infectious infiltrate particularly right perihilar region and left base not excluded. Left-sided pleural effusion may be present. Biapical pleural thickening without associated bony destruction. No pneumothorax. Calcified aorta. No acute osseous abnormality noted. Fusion thoracic spine. IMPRESSION: 1. Decrease in degree of pulmonary hilar and basilar consolidation. This may represent improving pulmonary edema. Infectious infiltrate particularly right perihilar region and left base not excluded. Left-sided pleural effusion may be present. 2. Cardiomegaly post CABG 3.  Aortic Atherosclerosis (ICD10-I70.0). Electronically Signed   By: Lacy Duverney M.D.   On: 04/14/2018 07:43    Scheduled Meds: . aspirin EC  81 mg Oral Daily  . budesonide (PULMICORT) nebulizer solution  0.5 mg Nebulization BID  . diltiazem  180 mg Oral Daily  . finasteride  5 mg Oral Daily  . FLUoxetine  10 mg Oral  Daily  . heparin injection (subcutaneous)  5,000 Units Subcutaneous Q8H  . insulin aspart  0-5 Units Subcutaneous QHS  . insulin aspart  0-9 Units Subcutaneous TID WC  . ipratropium-albuterol  3 mL Nebulization Q6H  . metoprolol succinate  100 mg Oral Daily  . QUEtiapine  12.5 mg Oral QHS  . sodium chloride flush  3 mL Intravenous Q12H   Continuous Infusions: . sodium chloride Stopped (04/10/18 1040)  . ampicillin-sulbactam (UNASYN) IV 3 g (04/14/18 1122)  . fluconazole (DIFLUCAN) IV      Assessment/Plan:  1. Acute delirium on dementia.  Speech therapy recommended n.p.o.  Start Haldol 1 mg IV every 6 hours as needed.  Try to give Seroquel 12.5 mg at night.  Everything will key on his mental status.  2. Aspiration pneumonia.  Speech therapy made n.p.o. today.  Continue IV Unasyn.   3. Acute hypoxic hypercapnic respiratory failure.  Patient required BiPAP last night.  Currently was off oxygen when I saw him. 4. Acute kidney injury with hyperkalemia.  Creatinine improved to 2.34 today.  Fluids stopped. 5. Acute on chronic systolic and diastolic CHF with EF of 45 to 16%.  Holding off on ACE inhibitor with acute kidney injury.  6. Sepsis on presentation secondary to urinary tract infection that was treated.  Citrobacter grew out of the urine culture. 7. Atrial fibrillation with rapid ventricular response.  Continue rate controlling medications of metoprolol and Cardizem 8. Essential hypertension.  9. BPH finasteride  10. Hyperlipidemia on Pravachol 11. Neuropathy.  Hold gabapentin with acute kidney injury 12. Type 2 diabetes mellitus-on sliding scale only 13. Discontinue Foley catheter before he pulls it out.  Code Status:     Code Status Orders  (From admission, onward)         Start     Ordered   04-17-18 2151  Full code  Continuous     17-Apr-2018 2150        Code Status History  Date Active Date Inactive Code Status Order ID Comments User Context   02/22/2018 1443  02/24/2018 2003 Full Code [id number]  Shaune Pollack, MD ED     Family Communication: Son at the bedside Disposition Plan: Likely will end up needing rehab  Consultants:  Cardiology  Speech therapy  Antibiotics:  Unasyn  Time spent: 28 minutes.   Maggie Senseney [employer]

## 2018-04-14 NOTE — Progress Notes (Signed)
eLink Physician-Brief Progress Note Patient Name: Fred Watson DOB: 1927-05-01 MRN: [id number]   Date of Service  04/14/2018  HPI/Events of Note  Patient had change in mental status on floor. RR called upon arrival to room pt was noted to have on NRB sats 100% pt unresponsive. Blood sugar and VS WNL.Marland Kitchen Sternal rub performed with minimal response.spoke with MD new orders for a head CT and transfer to ICU. PCCM consulted to assume care. VSS.   eICU Interventions  No new orders.      Intervention Category Evaluation Type: New Patient Evaluation  Sommer,Steven Eugene 04/14/2018, 1:11 AM

## 2018-04-14 NOTE — Plan of Care (Signed)
Patient tolerating BiPap.  No acute distress noted. Attempts to take BiPap off.  NT at bedside at this time.  Son at bedtime. Multiple attempts at redirection.  Will continue to monitor.

## 2018-04-14 NOTE — Progress Notes (Signed)
Patient transferred to ICU from RM 251 on non-rebreather mask. Patient not responsive to questions or commands.  Patient will open eyes and moan with movement and patient care.  Will continue to monitor.

## 2018-04-14 NOTE — Consult Note (Signed)
Pharmacy Antibiotic Note  Fred Watson is a 84 y.o. male admitted on 2018/04/10 with sepsis secondary to UTI.  Subsequently, pt has been diagnosed with aspiration pneumonia.  Pharmacy has been consulted for unasyn dosing.  Plan: Day 4 IV Unasyn for aspiration. Continue Unasyn 3g IV q12hr based on CrCl < 30  Height: 6\' 2"  (188 cm) Weight: 189 lb 13.1 oz (86.1 kg) IBW/kg (Calculated) : 82.2  Temp (24hrs), Avg:97.7 F (36.5 C), Min:96.2 F (35.7 C), Max:98.6 F (37 C)  Recent Labs  Lab 04/09/18 0511 04/10/18 0612 04/11/18 0305 04/12/18 0713 04/12/18 1422 04/13/18 0528 04/14/18 0107 04/14/18 0119  WBC 7.7 15.8* 17.9* 14.4*  --   --  10.6*  --   CREATININE 1.89* 2.59* 2.89* 3.47* 3.09* 2.88* 2.34*  --   LATICACIDVEN  --   --   --   --   --   --   --  1.1    Estimated Creatinine Clearance: 24.9 mL/min (A) (by C-G formula based on SCr of 2.34 mg/dL (H)).    Allergies  Allergen Reactions  . Ezetimibe Other (See Comments)  . Niacin Other (See Comments)    Flushing  . Ace Inhibitors Cough    Tolerating lisinopril 20 mg without side effects 02/01/2015  . Atorvastatin Other (See Comments)    Nightmares  . Losartan Other (See Comments)    Antimicrobials this admission: 10/05 cefepime >> 10/10 10/11 keflex >> 10/11 10/11 unasyn >>   Microbiology results: 10/05 BCx: coag neg staph 1/4 - contaminate 10/05 UCx: citrobacter > 10,000  10/06 MRSA PCR: NEG  Thank you for allowing pharmacy to be a part of this patient's care.  Gardner Candle, PharmD, BCPS Clinical Pharmacist 04/14/2018 2:08 PM

## 2018-04-14 NOTE — Consult Note (Signed)
Name: Fred Watson MRN: [id number] DOB: 07/23/1926    ADMISSION DATE:  04-07-18 CONSULTATION DATE: 04/14/18  REFERRING MD : Dr. Caryn Bee   CHIEF COMPLAINT: Respiratory Failure   BRIEF PATIENT DESCRIPTION:  84 yo male admitted with Citrobacter UTI, acute renal failure with hyperkalemia, acute encephalopathy, and acute on chronic hypoxic hypercapnic respiratory failure secondary to aspiration pneumonia and AECOPD, and acute on chronic systolic CHF requiring Bipap   SIGNIFICANT EVENTS/STUDIES:  10/5-Pt admitted to [facility] unit with urosepsis  10/8-Pt transferred to stepdown unit with acute hypercapnic respiratory failure requiring Bipap  10/9-Pt transferred out of stepdown unit to telemetry unit  10/14-Pt transferred to ICU with respiratory failure requiring Bipap  10/14-CT Head revealed No acute intracranial abnormalities. Chronic atrophy and small vessel ischemic changes. Left mastoid effusions. Fluid in the left  middle ear may indicate otomastoiditis.  HISTORY OF PRESENT ILLNESS:   Fred Watson is an 84 year old gentleman with a past medical history remarkable for coronary artery disease, ischemic cardiomyopathy with subsequent systolic heart failure, hypertension, hyperlipidemia, macular degeneration, hearing loss, dementia, COPD, initially was admitted for generalized weakness, found to be uroseptic also had left sided pneumonia, renal insufficiency and hyperkalemia.  He was initially admitted, started on vancomycin and cefepime, received fluid resuscitation with close follow-up of laboratory and electrolytes him a urinalysis was positive for Citrobacter, which continued on cefepime.  He was subsequently admitted to the [facility] unit by hospitalist team for further workup and treatment.  Hospital course as listed above.   PAST MEDICAL HISTORY :   has a past medical history of Arthritis, Cardiomyopathy (HCC), CHF (congestive heart failure) (HCC), COPD (chronic obstructive pulmonary disease)  (HCC), Coronary artery disease, Depression, Diabetes mellitus without complication (HCC), Hearing loss, Hyperlipidemia, Hypertension, and Macular degeneration.  has a past surgical history that includes Coronary artery bypass graft; Cholecystectomy; Hernia repair; and Cataract extraction w/PHACO (Left, 11/12/2017). Prior to Admission medications   Medication Sig Start Date End Date Taking? Authorizing Provider  aspirin EC 81 MG tablet Take 81 mg by mouth daily.   Yes [provider]  cholecalciferol (VITAMIN D) 1000 units tablet Take 1,000 Units by mouth daily.   Yes [provider]  docusate sodium (COLACE) 100 MG capsule Take 100 mg by mouth 2 (two) times daily.   Yes [provider]  finasteride (PROSCAR) 5 MG tablet Take 5 mg by mouth daily.   Yes [provider]  FLUoxetine (PROZAC) 10 MG capsule Take 1 capsule by mouth daily. April 07, 2018  Yes [provider]  furosemide (LASIX) 40 MG tablet Take 1 tablet by mouth daily. Apr 07, 2018  Yes [provider]  gabapentin (NEURONTIN) 300 MG capsule Take 300 mg by mouth 2 (two) times daily.   Yes [provider]  glimepiride (AMARYL) 2 MG tablet Take 1 tablet (2 mg total) by mouth daily with breakfast. 02/24/18 04/25/18 Yes Sainani, Rolly Pancake, MD  lisinopril (PRINIVIL,ZESTRIL) 20 MG tablet Take 20 mg by mouth daily.   Yes [provider]  metoprolol succinate (TOPROL-XL) 100 MG 24 hr tablet Take 100 mg by mouth daily. Take with or immediately following a meal.   Yes [provider]  pravastatin (PRAVACHOL) 40 MG tablet Take 40 mg by mouth daily.   Yes [provider]   Allergies  Allergen Reactions  . Ezetimibe Other (See Comments)  . Niacin Other (See Comments)    Flushing  . Ace Inhibitors Cough    Tolerating lisinopril 20 mg without side effects 02/01/2015  .  Atorvastatin Other (See Comments)    Nightmares  . Losartan Other (See Comments)    FAMILY HISTORY:    family history includes CAD in his father; Heart attack in his father; Stroke in his mother. SOCIAL HISTORY:  reports that he quit smoking about 60 years ago. He smoked 1.00 pack per day. He has never used smokeless tobacco.  REVIEW OF SYSTEMS:   Unable to assess pt non vocal at this time   SUBJECTIVE:  Unable to assess pt non vocal at this time   VITAL SIGNS: Temp:  [97.6 F (36.4 C)-98 F (36.7 C)] 97.6 F (36.4 C) (10/13 2015) Pulse Rate:  [65-92] 75 (10/14 0015) Resp:  [16] 16 (10/13 1435) BP: (123-170)/(58-87) 123/58 (10/14 0015) SpO2:  [82 %-100 %] 100 % (10/14 0015) Weight:  [86.4 kg] 86.4 kg (10/13 0416)  PHYSICAL EXAMINATION: General: chronically ill appearing male, NAD on NRB  Neuro: somnolent, withdraws from vigorous stimulation, PERRL HEENT: supple, no JVD  Cardiovascular: irregular irregular, no R/G Lungs: rhonchi and diminished throughout, even, non labored  Abdomen: +BS x4, soft, non distended  Musculoskeletal: no edema  Skin: scattered ecchymosis, LUE skin tear, moisture associated skin damage at bilateral groin and buttocks    Recent Labs  Lab 04/12/18 0713 04/12/18 1422 04/13/18 0528  NA 135 137 139  K 5.7* 4.4 4.6  CL 103 105 106  CO2 18* 23 24  BUN 105* 105* 98*  CREATININE 3.47* 3.09* 2.88*  GLUCOSE 196* 233* 185*   Recent Labs  Lab 04/10/18 0612 04/11/18 0305 04/12/18 0713  HGB 10.8* 10.7* 11.1*  HCT 33.4* 32.5* 32.7*  WBC 15.8* 17.9* 14.4*  PLT 302 327 214   Dg Chest 1 View  Result Date: 04/13/2018 CLINICAL DATA:  Patient with cough and wheeze EXAM: CHEST  1 VIEW COMPARISON:  Chest radiograph 04/11/2018 FINDINGS: Stable enlarged cardiac and mediastinal contours. Interval increase in patchy consolidation within the left and right mid lung. Moderate left pleural effusion. No pneumothorax. IMPRESSION: Increasing consolidation within the left mid lung and right mid lung which may represent infection and/or edema. Cardiomegaly. Moderate  left pleural effusion. Electronically Signed   By: Annia Belt M.D.   On: 04/13/2018 21:11   US Renal  Result Date: 04/12/2018 CLINICAL DATA:  Acute renal failure EXAM: RENAL / URINARY TRACT ULTRASOUND COMPLETE COMPARISON:  None. FINDINGS: Right Kidney: Length: 9.9 cm. Echogenicity within normal limits. No mass or hydronephrosis visualized. Left Kidney: Length: 10.6 cm. Echogenicity within normal limits. No mass or hydronephrosis visualized. Bladder: Appears normal for degree of bladder distention. IMPRESSION: No acute abnormality noted. Electronically Signed   By: Alcide Clever M.D.   On: 04/12/2018 13:06    ASSESSMENT / PLAN:  Acute hypoxic hypercapnic respiratory failure secondary to aspiration pneumonia and AECOPD Prn Bipap for dyspnea and/or hypoxia  Scheduled and prn bronchodilator therapy  Continue nebulized steroids Check abg  Repeat CXR in am   Atrial fibrillation-rate controlled  Acute on chronic systolic CHF  Hx: HTN, HLD, and CAD s/p CABG  Continuous telemetry monitoring  Continue aspirin, cardizem, proscar, pravachol, metoprolol, and iv lasix  Cardiology consulted appreciate input   Acute on chronic renal failure-renal ultrasound negative for obstruction  Trend BMP Replace electrolytes as indicated  Monitor UOP  Avoid nephrotoxic medications  Nephrology consulted appreciate input   Anemia without acute blood loss VTE px: subq heparin  Trend CBC Monitor for s/sx of bleeding and transfuse for hgb <7  Aspiration pneumonia  Citrobacter UTI  Trend  WBC and monitor fever curve Check PCT and lactic acid  Repeat blood and urine cultures Continue unasyn  Speech therapy consulted appreciate input   Type II Diabetes Mellitus  CBG ac/hs SSI   Acute encephalopathy likely secondary to hypercapnia  Hx: Dementia  Avoid sedating medications   -I spoke with pts son Courvoisier Hamblen via telephone to inform him of decline in pts condition and transfer to ICU.  He acknowledges his  father's health has been waxing and waning. He stated he is on his way to the hospital, all questions were answered.   -Palliative care consulted appreciate input pt has overall poor prognosis due to multiple co-morbidities recommend changing code status to DNR  Sonda Rumble, AGNP  Pulmonary/Critical Care Pager [phone] (please enter 7 digits) PCCM Consult Pager [phone] (please enter 7 digits)

## 2018-04-14 NOTE — Progress Notes (Signed)
[facility], [place] 04/14/18  Subjective:   UOP 1075 cc Cr lower at 2.34 (2.88) Patient was transferred to ICU overnight for decreased responsiveness Family member at bediside Patient moaning + cough . Objective:  Vital signs in last 24 hours:  Temp:  [96.2 F (35.7 C)-98.6 F (37 C)] 98.6 F (37 C) (10/14 0800) Pulse Rate:  [68-92] 92 (10/14 0800) Resp:  [13-25] 20 (10/14 0858) BP: (106-170)/(55-84) 122/66 (10/14 0800) SpO2:  [82 %-100 %] 100 % (10/14 0858) FiO2 (%):  [40 %] 40 % (10/14 0433) Weight:  [87.9 kg] 87.9 kg (10/14 0015)  Weight change: 1.535 kg Filed Weights   04/12/18 0500 04/13/18 0416 04/14/18 0015  Weight: 86.3 kg 86.4 kg 87.9 kg    Intake/Output:    Intake/Output Summary (Last 24 hours) at 04/14/2018 0943 Last data filed at 04/14/2018 0900 Gross per 24 hour  Intake 718.57 ml  Output 1175 ml  Net -456.43 ml     Physical Exam: General:  No acute distress, laying in the bed  HEENT  anicteric, dry oral mucous membranes, decreased hearing  Neck  supple  Pulm/lungs  coarse breath sounds, crackles bilaterally   CVS/Heart  irregular rhythm  Abdomen:   Soft, nontender, nondistended  Extremities:  No peripheral edema  Neurologic:  Alert, moaning  Skin:  Scattered bruises          Basic Metabolic Panel:  Recent Labs  Lab 04/11/18 0305 04/12/18 0713 04/12/18 1422 04/13/18 0528 04/14/18 0107  NA 134* 135 137 139 141  K 4.6 5.7* 4.4 4.6 5.0  CL 102 103 105 106 109  CO2 21* 18* 23 24 22   GLUCOSE 202* 196* 233* 185* 219*  BUN 100* 105* 105* 98* 90*  CREATININE 2.89* 3.47* 3.09* 2.88* 2.34*  CALCIUM 8.3* 8.1* 8.1* 8.1* 7.8*     CBC: Recent Labs  Lab 04/09/18 0511 04/10/18 0612 04/11/18 0305 04/12/18 0713 04/14/18 0107  WBC 7.7 15.8* 17.9* 14.4* 10.6*  NEUTROABS  --  15.1*  --   --  9.0*  HGB 11.3* 10.8* 10.7* 11.1* 11.3*  HCT 34.5* 33.4* 32.5* 32.7* 35.5*  MCV 96.1 97.7 96.4 94.5 98.3  PLT 237 302  327 214 309     No results found for: HEPBSAG, HEPBSAB, HEPBIGM    Microbiology:  Recent Results (from the past 240 hour(s))  Urine culture     Status: Abnormal   Collection Time: 18-Apr-2018  5:16 PM  Result Value Ref Range Status   Specimen Description   Final    URINE, RANDOM Performed at [facility], [address], [place], [place] [address]    Special Requests   Final    NONE Performed at [facility], [address] [place], [place] [address]    Culture >=100,000 COLONIES/mL CITROBACTER KOSERI (A)  Final   Report Status 04/08/2018 FINAL  Final   Organism ID, Bacteria CITROBACTER KOSERI (A)  Final      Susceptibility   Citrobacter koseri - MIC*    CEFAZOLIN <=4 SENSITIVE Sensitive     CEFTRIAXONE <=1 SENSITIVE Sensitive     CIPROFLOXACIN <=0.25 SENSITIVE Sensitive     GENTAMICIN <=1 SENSITIVE Sensitive     IMIPENEM <=0.25 SENSITIVE Sensitive     NITROFURANTOIN 32 SENSITIVE Sensitive     TRIMETH/SULFA <=20 SENSITIVE Sensitive     PIP/TAZO <=4 SENSITIVE Sensitive     * >=100,000 COLONIES/mL CITROBACTER KOSERI  Blood Culture (routine x 2)     Status:  None   Collection Time: 04/01/2018  5:17 PM  Result Value Ref Range Status   Specimen Description BLOOD  RT WRIST  Final   Special Requests   Final    BOTTLES DRAWN AEROBIC AND ANAEROBIC Blood Culture adequate volume   Culture   Final    NO GROWTH 5 DAYS Performed at [facility], [address] [place], [place] [address]    Report Status 04/10/2018 FINAL  Final  Blood Culture (routine x 2)     Status: Abnormal   Collection Time: 04/27/2018  5:17 PM  Result Value Ref Range Status   Specimen Description   Final    BLOOD LAC Performed at [facility], [address] [place], [place] [address]    Special Requests   Final    BOTTLES DRAWN AEROBIC AND ANAEROBIC Blood Culture adequate volume Performed at [facility], [address] [place], [place] [address]    Culture   Setup Time   Final    AEROBIC BOTTLE ONLY GRAM POSITIVE COCCI IN CLUSTERS CRITICAL RESULT CALLED TO, READ BACK BY AND VERIFIED WITH: CHRIS NESBITT AT 1540 ON 04/06/18 BY [facility] GRAM STAIN REVIEWED-AGREE WITH RESULT    Culture (A)  Final    STAPHYLOCOCCUS SPECIES (COAGULASE NEGATIVE) THE SIGNIFICANCE OF ISOLATING THIS ORGANISM FROM A SINGLE SET OF BLOOD CULTURES WHEN MULTIPLE SETS ARE DRAWN IS UNCERTAIN. PLEASE NOTIFY THE MICROBIOLOGY DEPARTMENT WITHIN ONE WEEK IF SPECIATION AND SENSITIVITIES ARE REQUIRED. Performed at [facility], [address], [place], [place] [address]    Report Status 04/08/2018 FINAL  Final  Blood Culture ID Panel (Reflexed)     Status: Abnormal   Collection Time: 04/04/2018  5:17 PM  Result Value Ref Range Status   Enterococcus species NOT DETECTED NOT DETECTED Final   Listeria monocytogenes NOT DETECTED NOT DETECTED Final   Staphylococcus species DETECTED (A) NOT DETECTED Final    Comment: Methicillin (oxacillin) susceptible coagulase negative staphylococcus. Possible blood culture contaminant (unless isolated from more than one blood culture draw or clinical case suggests pathogenicity). No antibiotic treatment is indicated for blood  culture contaminants. CRITICAL RESULT CALLED TO, READ BACK BY AND VERIFIED WITH: CHRIS NESBITT AT 1540 ON 04/06/18 BY SNJ    Staphylococcus aureus (BCID) NOT DETECTED NOT DETECTED Final   Methicillin resistance NOT DETECTED NOT DETECTED Final   Streptococcus species NOT DETECTED NOT DETECTED Final   Streptococcus agalactiae NOT DETECTED NOT DETECTED Final   Streptococcus pneumoniae NOT DETECTED NOT DETECTED Final   Streptococcus pyogenes NOT DETECTED NOT DETECTED Final   Acinetobacter baumannii NOT DETECTED NOT DETECTED Final   Enterobacteriaceae species NOT DETECTED NOT DETECTED Final   Enterobacter cloacae complex NOT DETECTED NOT DETECTED Final   Escherichia coli NOT DETECTED NOT DETECTED Final   Klebsiella oxytoca NOT  DETECTED NOT DETECTED Final   Klebsiella pneumoniae NOT DETECTED NOT DETECTED Final   Proteus species NOT DETECTED NOT DETECTED Final   Serratia marcescens NOT DETECTED NOT DETECTED Final   Haemophilus influenzae NOT DETECTED NOT DETECTED Final   Neisseria meningitidis NOT DETECTED NOT DETECTED Final   Pseudomonas aeruginosa NOT DETECTED NOT DETECTED Final   Candida albicans NOT DETECTED NOT DETECTED Final   Candida glabrata NOT DETECTED NOT DETECTED Final   Candida krusei NOT DETECTED NOT DETECTED Final   Candida parapsilosis NOT DETECTED NOT DETECTED Final   Candida tropicalis NOT DETECTED NOT DETECTED Final    Comment: Performed at [facility], [address] [place],  [place] [address]  MRSA PCR Screening     Status: None   Collection Time: 04/06/18  3:25 PM  Result Value Ref Range Status   MRSA by PCR NEGATIVE NEGATIVE Final    Comment:        The GeneXpert MRSA Assay (FDA approved for NASAL specimens only), is one component of a comprehensive MRSA colonization surveillance program. It is not intended to diagnose MRSA infection nor to guide or monitor treatment for MRSA infections. Performed at [facility], [address] [place], [place] [address]   CULTURE, BLOOD (ROUTINE X 2) w Reflex to ID Panel     Status: None (Preliminary result)   Collection Time: 04/14/18  1:19 AM  Result Value Ref Range Status   Specimen Description BLOOD RIGHT HAND  Final   Special Requests   Final    BOTTLES DRAWN AEROBIC AND ANAEROBIC Blood Culture adequate volume   Culture   Final    NO GROWTH < 12 HOURS Performed at [facility], [address], [place], [place] [address]    Report Status PENDING  Incomplete  CULTURE, BLOOD (ROUTINE X 2) w Reflex to ID Panel     Status: None (Preliminary result)   Collection Time: 04/14/18  1:43 AM  Result Value Ref Range Status   Specimen Description BLOOD LEFT HAND  Final   Special Requests   Final    BOTTLES DRAWN  AEROBIC AND ANAEROBIC Blood Culture adequate volume   Culture   Final    NO GROWTH < 12 HOURS Performed at [facility], [address] [place], [place] [address]    Report Status PENDING  Incomplete    Coagulation Studies: No results for input(s): LABPROT, INR in the last 72 hours.  Urinalysis: No results for input(s): COLORURINE, LABSPEC, PHURINE, GLUCOSEU, HGBUR, BILIRUBINUR, KETONESUR, PROTEINUR, UROBILINOGEN, NITRITE, LEUKOCYTESUR in the last 72 hours.  Invalid input(s): APPERANCEUR    Imaging: Dg Chest 1 View  Result Date: 04/13/2018 CLINICAL DATA:  Patient with cough and wheeze EXAM: CHEST  1 VIEW COMPARISON:  Chest radiograph 04/11/2018 FINDINGS: Stable enlarged cardiac and mediastinal contours. Interval increase in patchy consolidation within the left and right mid lung. Moderate left pleural effusion. No pneumothorax. IMPRESSION: Increasing consolidation within the left mid lung and right mid lung which may represent infection and/or edema. Cardiomegaly. Moderate left pleural effusion. Electronically Signed   By: Annia Belt M.D.   On: 04/13/2018 21:11   Ct Head Wo Contrast  Result Date: 04/14/2018 CLINICAL DATA:  Altered mental status. Patient was found unresponsive. EXAM: CT HEAD WITHOUT CONTRAST TECHNIQUE: Contiguous axial images were obtained from the base of the skull through the vertex without intravenous contrast. COMPARISON:  None. FINDINGS: Brain: Diffuse cerebral atrophy. Ventricular dilatation consistent with central atrophy. Low-attenuation changes in the deep white matter consistent with small vessel ischemia. No mass effect or midline shift. No abnormal extra-axial fluid collections. Gray-white matter junctions are distinct. Basal cisterns are not effaced. No acute intracranial hemorrhage. Vascular: Moderate intracranial arterial vascular calcifications are present. Skull: Calvarium appears intact. Sinuses/Orbits: Paranasal sinuses are clear. Diffuse  opacification of the left mastoid air cells and left middle ear. Other: None. IMPRESSION: No acute intracranial abnormalities. Chronic atrophy and small vessel ischemic changes. Left mastoid effusions. Fluid in the left middle ear may indicate otomastoiditis. Electronically Signed   By: Burman Nieves M.D.   On: 04/14/2018 00:51   US Renal  Result Date: 04/12/2018 CLINICAL DATA:  Acute renal failure EXAM: RENAL /  URINARY TRACT ULTRASOUND COMPLETE COMPARISON:  None. FINDINGS: Right Kidney: Length: 9.9 cm. Echogenicity within normal limits. No mass or hydronephrosis visualized. Left Kidney: Length: 10.6 cm. Echogenicity within normal limits. No mass or hydronephrosis visualized. Bladder: Appears normal for degree of bladder distention. IMPRESSION: No acute abnormality noted. Electronically Signed   By: Alcide Clever M.D.   On: 04/12/2018 13:06   Dg Chest Port 1 View  Result Date: 04/14/2018 CLINICAL DATA:  84 year old male with acute respiratory failure. Subsequent encounter. EXAM: PORTABLE CHEST 1 VIEW COMPARISON:  04/13/2018 chest x-ray. FINDINGS: Cardiomegaly post CABG. Decrease in degree of pulmonary hilar and basilar consolidation. This may represent improving pulmonary edema. Infectious infiltrate particularly right perihilar region and left base not excluded. Left-sided pleural effusion may be present. Biapical pleural thickening without associated bony destruction. No pneumothorax. Calcified aorta. No acute osseous abnormality noted. Fusion thoracic spine. IMPRESSION: 1. Decrease in degree of pulmonary hilar and basilar consolidation. This may represent improving pulmonary edema. Infectious infiltrate particularly right perihilar region and left base not excluded. Left-sided pleural effusion may be present. 2. Cardiomegaly post CABG 3.  Aortic Atherosclerosis (ICD10-I70.0). Electronically Signed   By: Lacy Duverney M.D.   On: 04/14/2018 07:43     Medications:   . sodium chloride Stopped  (04/10/18 1040)  . ampicillin-sulbactam (UNASYN) IV Stopped (04/13/18 2352)  . fluconazole (DIFLUCAN) IV Stopped (04/13/18 1245)   . aspirin EC  81 mg Oral Daily  . budesonide (PULMICORT) nebulizer solution  0.5 mg Nebulization BID  . cholecalciferol  1,000 Units Oral Daily  . diltiazem  180 mg Oral Daily  . docusate sodium  100 mg Oral BID  . finasteride  5 mg Oral Daily  . FLUoxetine  10 mg Oral Daily  . heparin injection (subcutaneous)  5,000 Units Subcutaneous Q8H  . insulin aspart  0-20 Units Subcutaneous TID WC  . ipratropium-albuterol  3 mL Nebulization Q6H  . metoprolol succinate  100 mg Oral Daily  . pravastatin  40 mg Oral Daily  . sodium chloride flush  3 mL Intravenous Q12H   sodium chloride, acetaminophen **OR** acetaminophen, albuterol, diltiazem, labetalol, ondansetron **OR** ondansetron (ZOFRAN) IV, senna-docusate, traZODone  Assessment/ Plan:  84 y.o. Caucasian male with medical problems of COPD, CHF, CAD, DM-2, HTN, atrial fibrillation who was admitted to [facility] on 25-Apr-2018 with generalized weakness.   1.  Acute renal failure 2.  Urinary tract infection, Citrobacter 3.  Hyperkalemia 4.  Altered mental status 5.  Aspiration pneumonia  Patient has baseline fragile health and presently critically ill with multisystem involvement.  He has a urinary tract infection with Citrobacter.  Atrial fibrillation with hemodynamic instability.  Worsening renal failure with hyperkalemia.  He has developed altered mental status and confusion likely due to underlying illnesses. There are concerns about aspiration pneumonia. Renal ultrasound is negative for obstruction Serum creatinine slightly lower, UOP 1075 cc Continue supportive care.   LOS: 9 Joevanni Roddey Thedore Mins 10/14/20199:43 AM  [facility] [place], [place] [phone]  Note: This note was prepared with Dragon dictation. Any transcription errors are unintentional

## 2018-04-14 NOTE — Progress Notes (Signed)
RR called upon arrival to room pt was noted to have on  NRB sats 100% pt unresponsive. Blood sugar and VS WNL.Marland Kitchen Sternal rub performed with minimal response.spoke with MD new orders for a head CT and transfer to ICU.

## 2018-04-14 NOTE — Progress Notes (Signed)
Pharmacy Antibiotic Note  Fred Watson is a 84 y.o. male admitted on 2018-04-22 with Sepsis. Patient transitioned to Unasyn on 10/11 for aspiration pneumonia. Patient transferred to 1C from ICU on 10/14.  Pharmacy has been consulted for Unasyn dosing. Patient initiated on fluconazole on 10/12.   Plan: Continue Unasyn 3g IV Q12hr.   On ICU rounds, fluconazole transitioned to nystatin swish and spit QID for presumed thrush.   Height: 6\' 2"  (188 cm) Weight: 189 lb 13.1 oz (86.1 kg) IBW/kg (Calculated) : 82.2  Temp (24hrs), Avg:97.7 F (36.5 C), Min:96.2 F (35.7 C), Max:98.6 F (37 C)  Recent Labs  Lab 04/09/18 0511 04/10/18 0612 04/11/18 0305 04/12/18 0713 04/12/18 1422 04/13/18 0528 04/14/18 0107 04/14/18 0119  WBC 7.7 15.8* 17.9* 14.4*  --   --  10.6*  --   CREATININE 1.89* 2.59* 2.89* 3.47* 3.09* 2.88* 2.34*  --   LATICACIDVEN  --   --   --   --   --   --   --  1.1    Estimated Creatinine Clearance: 24.9 mL/min (A) (by C-G formula based on SCr of 2.34 mg/dL (H)).    Allergies  Allergen Reactions  . Ezetimibe Other (See Comments)  . Niacin Other (See Comments)    Flushing  . Ace Inhibitors Cough    Tolerating lisinopril 20 mg without side effects 02/01/2015  . Atorvastatin Other (See Comments)    Nightmares  . Losartan Other (See Comments)    Antimicrobials this admission: Azithromycin 10/5 x 1 Vancomycin 10/6 >> 10/6  Cefepime 10/5 >> 10/10 Cephalexin 10/11 x 1  Unasyn 10/11 >>  Fluconazole 10/12 >> 10/14  Dose adjustments this admission: 10/6 cefepime transitioned to 1g Q24h 10/7 cefepime transitioned to 2g Q24hr  10/8 cefepime transitioned to 2g Q12hr 10/9 cefepime transitioned to 2g Q24hr   Microbiology results: 10/5 BCx: coag negative staphylococcus  10/5 UCx: pansensitive Citrobacter Koseri  10/14 BCx: no growth < 12 hours  10/14 UCx: pending  10/6 MRSA PCR: negative    Thank you for allowing pharmacy to be a part of this patient's  care.  Miraya Cudney L 04/14/2018 2:10 PM

## 2018-04-14 NOTE — Progress Notes (Signed)
   04/14/18 0000  Clinical Encounter Type  Visited With Patient  Visit Type Code (Rapid Response)  Referral From Nurse  Recommendations Follow-up, as requested.  Chaplin responded to Rapid Response page and met the patient and care team during the assessment. As patient was not communicative and his sons were not present, Chaplain held space offering energetic prayer for the patient and staff. Chaplain contacted the patient's son, Ronalee Belts, and facilitated a conversation with the patient's nurse. Chaplain concluded the encounter with staff support.

## 2018-04-14 NOTE — Progress Notes (Signed)
Notified MD of pt with fluids going. Pt has hx of CHF and lungs with crackles. Pt becoming restless. Orders placed. Will continue to monitor and assess.

## 2018-04-14 NOTE — Progress Notes (Addendum)
  Speech Language Pathology Treatment:    Patient Details Name: Fred Watson MRN: [id number] DOB: 1926/07/03 Today's Date: 04/14/2018 Time: 4098-1191 SLP Time Calculation (min) (ACUTE ONLY): 30 min  Assessment / Plan / Recommendation Clinical Impression  Patient with recent decline in status, becoming non responsive overnight and subsequent transfer to ICU. Noted temperature spike and increased WBC count. Patient noted with wet congested coughing prior to PO trials. When given trial PO's, patient presented with overt s/s aspiration c/b immediate strong wet coughing following each of 2 tsp of puree and 2 tsp honey thick liquid. Noted mod to severe oral prep/coordination deficits and possible painful swallow due to observable wincing with effortful swallow. Wincing possibly due to presence of oral thrush and/or laryngeal penetration/aspiration. Recommend d/c PO diet for now. Rec NPO for now, MD may consider temporary alternative means of nutrition until patient is less confused and more cognitively appropriate for MBSS to determine safest most appropriate diet. Currently patient is unlikely to obtain adequate nutrition by mouth and presents with significant risk of aspiration with any PO diet. Discussed recommendations with patient's son Fred Watson, nursing, and MD.  HPI HPI: Pt is a 84 y.o. male with a known history of multiple medical issues including cardiomyopathy, congestive heart failure, COPD, coronary artery disease, type 2 diabetes mellitus, hyperlipidemia, hypertension, macular degeneration presented to the emergency room for generalized weakness.  Patient was unable to get up and move around.  Uses a rolling walker at home.  Lives with his son at home.  Patient also had low blood pressure at presentation.  He has history of recurrent urinary tract infections in the past.  He was evaluated in the emergency room found to be having elevated WBC count but urinalysis showed infection.  Code sepsis  was called.  Pt is HOH.  Unsure of his baseline Cognitive status as some Confusion was noted during interaction/engagement. Pt has been NPO.      SLP Plan  MBS;Other (Comment);New goals to be determined pending instrumental study(Rec. NPO except for meds for now, SLP to f/u and re-evaluate and bedside tomorrow; possible MBSS to determine safest appropriate diet when cognitively appropriate)       Recommendations  Diet recommendations: NPO; may consider temporary alternative means of nutrition until patient is less confused and more cognitively appropriate for MBSS to determine safest diet.                Oral Care Recommendations: Oral care QID;Staff/trained caregiver to provide oral care SLP Visit Diagnosis: Dysphagia, oropharyngeal phase (R13.12) Plan: MBS;Other (Comment);New goals to be determined pending instrumental study(Rec. NPO, SLP to f/u and re-evaluate at bedside tomorrow; possible MBSS in future to determine safest appropriate diet when cognitively appropriate)       GO                Jenee Spaugh, MA, CCC-SLP 04/14/2018, 10:36 AM

## 2018-04-15 DIAGNOSIS — R0602 Shortness of breath: Secondary | ICD-10-CM

## 2018-04-15 DIAGNOSIS — A419 Sepsis, unspecified organism: Secondary | ICD-10-CM

## 2018-04-15 DIAGNOSIS — Z7189 Other specified counseling: Secondary | ICD-10-CM

## 2018-04-15 DIAGNOSIS — Z515 Encounter for palliative care: Secondary | ICD-10-CM

## 2018-04-15 DIAGNOSIS — J9621 Acute and chronic respiratory failure with hypoxia: Secondary | ICD-10-CM

## 2018-04-15 DIAGNOSIS — J9622 Acute and chronic respiratory failure with hypercapnia: Secondary | ICD-10-CM

## 2018-04-15 DIAGNOSIS — R6521 Severe sepsis with septic shock: Secondary | ICD-10-CM

## 2018-04-15 LAB — RENAL FUNCTION PANEL
Albumin: 2.5 g/dL — ABNORMAL LOW (ref 3.5–5.0)
Anion gap: 7 (ref 5–15)
BUN: 79 mg/dL — AB (ref 8–23)
CALCIUM: 8 mg/dL — AB (ref 8.9–10.3)
CO2: 25 mmol/L (ref 22–32)
CREATININE: 2.2 mg/dL — AB (ref 0.61–1.24)
Chloride: 113 mmol/L — ABNORMAL HIGH (ref 98–111)
GFR calc non Af Amer: 25 mL/min — ABNORMAL LOW (ref >60)
GFR, EST AFRICAN AMERICAN: 29 mL/min — AB (ref >60)
GLUCOSE: 213 mg/dL — AB (ref 70–99)
Phosphorus: 3.9 mg/dL (ref 2.5–4.6)
Potassium: 5.3 mmol/L — ABNORMAL HIGH (ref 3.5–5.1)
SODIUM: 145 mmol/L (ref 135–145)

## 2018-04-15 LAB — URINE CULTURE: Culture: NO GROWTH

## 2018-04-15 LAB — PROCALCITONIN: PROCALCITONIN: 0.4 ng/mL

## 2018-04-15 LAB — GLUCOSE, CAPILLARY
GLUCOSE-CAPILLARY: 189 mg/dL — AB (ref 70–99)
Glucose-Capillary: 163 mg/dL — ABNORMAL HIGH (ref 70–99)

## 2018-04-15 MED ORDER — HALOPERIDOL LACTATE 5 MG/ML IJ SOLN: 0.5000 mg | INTRAMUSCULAR | Status: DC | PRN

## 2018-04-15 MED ORDER — MORPHINE SULFATE (CONCENTRATE) 10 MG/0.5ML PO SOLN
5.0000 mg | ORAL | Status: DC
  Administered 2018-04-15 – 2018-04-16 (×5): 5 mg via SUBLINGUAL
  Filled 2018-04-15 (×5): qty 1

## 2018-04-15 MED ORDER — GLYCOPYRROLATE 1 MG PO TABS
1.0000 mg | ORAL_TABLET | ORAL | Status: DC | PRN
  Filled 2018-04-15: qty 1

## 2018-04-15 MED ORDER — SODIUM CHLORIDE 0.9 % IV SOLN
INTRAVENOUS | Status: DC | PRN
  Administered 2018-04-15: 10 mL via INTRAVENOUS

## 2018-04-15 MED ORDER — MORPHINE SULFATE (CONCENTRATE) 10 MG/0.5ML PO SOLN: 5.0000 mg | ORAL | Status: DC | PRN

## 2018-04-15 MED ORDER — LORAZEPAM 2 MG/ML PO CONC
1.0000 mg | ORAL | Status: DC | PRN
  Administered 2018-04-15 – 2018-04-16 (×2): 1 mg via SUBLINGUAL
  Filled 2018-04-15 (×2): qty 1

## 2018-04-15 MED ORDER — HALOPERIDOL LACTATE 2 MG/ML PO CONC
0.5000 mg | ORAL | Status: DC | PRN
  Filled 2018-04-15: qty 0.3

## 2018-04-15 MED ORDER — LORAZEPAM 1 MG PO TABS: 1.0000 mg | ORAL_TABLET | ORAL | Status: DC | PRN

## 2018-04-15 MED ORDER — FLUCONAZOLE 100MG IVPB
100.0000 mg | INTRAVENOUS | Status: DC
  Administered 2018-04-15: 13:00:00 100 mg via INTRAVENOUS
  Filled 2018-04-15 (×3): qty 50

## 2018-04-15 MED ORDER — HALOPERIDOL 0.5 MG PO TABS
0.5000 mg | ORAL_TABLET | ORAL | Status: DC | PRN
  Filled 2018-04-15: qty 1

## 2018-04-15 MED ORDER — POLYVINYL ALCOHOL 1.4 % OP SOLN
1.0000 [drp] | Freq: Four times a day (QID) | OPHTHALMIC | Status: DC | PRN
  Filled 2018-04-15: qty 15

## 2018-04-15 MED ORDER — MELATONIN 5 MG PO TABS
5.0000 mg | ORAL_TABLET | Freq: Every day | ORAL | Status: DC
  Filled 2018-04-15 (×2): qty 1

## 2018-04-15 MED ORDER — MORPHINE SULFATE (PF) 2 MG/ML IV SOLN
1.0000 mg | INTRAVENOUS | Status: DC | PRN
  Administered 2018-04-15: 1 mg via INTRAVENOUS
  Filled 2018-04-15: qty 1

## 2018-04-15 MED ORDER — GLYCOPYRROLATE 0.2 MG/ML IJ SOLN
0.2000 mg | INTRAMUSCULAR | Status: DC | PRN
  Filled 2018-04-15: qty 1

## 2018-04-15 MED ORDER — BIOTENE DRY MOUTH MT LIQD: 15.0000 mL | OROMUCOSAL | Status: DC | PRN

## 2018-04-15 MED ORDER — LORAZEPAM 2 MG/ML IJ SOLN: 1.0000 mg | INTRAMUSCULAR | Status: DC | PRN

## 2018-04-15 MED ORDER — MORPHINE SULFATE (PF) 2 MG/ML IV SOLN: 1.0000 mg | INTRAVENOUS | Status: DC | PRN

## 2018-04-15 MED ORDER — TRAZODONE HCL 50 MG PO TABS: 100.0000 mg | ORAL_TABLET | Freq: Every evening | ORAL | Status: DC | PRN

## 2018-04-15 NOTE — Consult Note (Signed)
Consultation Note Date: 04/15/2018   Patient Name: Fred Watson  DOB: [birth year]  MRN: [id number]  Age / Sex: 84 y.o., male  PCP: Ezequiel Kayser, MD Referring Physician: Loletha Grayer, MD  Reason for Consultation: Establishing goals of care  HPI/Patient Profile: 84 y.o. male  with past medical history of CHF- cardiomyopathy, COPD, dementia, CAD, DM2, HLD admitted on 04/28/2018 with sepsis from UTI and pneumonia. Hospitalization has been complicated by a fib, respiratory failure multiple times requiring bipap, ongoing aspiration. Patient has been eating and drinking very little. Palliative medicine consulted for [place].   Clinical Assessment and Goals of Care:  I have reviewed medical records including EPIC notes, labs and imaging, assessed the patient and then met at the bedside along with patient's two sons- Ronalee Belts and Richardson Landry-  to discuss diagnosis prognosis, GOC, EOL wishes, disposition and options.  I introduced Palliative Medicine as specialized medical care for people living with serious illness. It focuses on providing relief from the symptoms and stress of a serious illness. The goal is to improve quality of life for both the patient and the family.  We discussed a brief life review of the patient. He worked as a Freight forwarder in [employer]. He was married twice and widowed by both. He was known as a kind man, always giving to anyone in need.   As far as functional and nutritional status- his sons have noted steep decline, especially in the last year. His weight weight has dropped in the last few months by 30 pounds. He has been living a bed to chair existence, ambulating very little in the home without becoming SOB.    We discussed their current illness and what it means in the larger context of their on-going co-morbidities.  Natural disease trajectory and expectations at EOL were discussed.   I attempted to  elicit values and goals of care important to the patient. Both sons state that the patient had very poor quality of life, he valued interaction with family and since losing his last wife about a year ago, he had dramatically declined.   The difference between aggressive medical intervention (NPO status, feeding tube, antibiotics, IV fluids, repeated hospitalizations) and comfort care (medications given only for comfort and allowing patient to experience natural dying process with support, treating symptoms of suffering) was explained in light of the patient's goals of care.   Advanced directives, concepts specific to code status, artifical feeding and hydration, and rehospitalization were considered and discussed. Patient is currently DNR. Sons elect for comfort care with comfort feedings allowed, no artificial feeding or hydration, no reshospitalization.   Hospice and Palliative Care services outpatient were explained and offered. Family would like patient to transfer to residential hospice house if he is stable enough for transfer once he has been transitioned to comfort measures only.   Questions and concerns were addressed.  The family was encouraged to call with questions or concerns.   Primary Decision Maker NEXT OF KIN- patient's sons- Ronalee Belts and Richardson Landry    [place]  Transition to full comfort measures  44m morphine concentrate SL q4hr  SOB, air hunger- can hold if patient appears comfortable  560mmorphine concentrate SL q1hr prn SOB, air hunger, agitation  Morphine 38m108mV q1hrs dyspnea  Lorazepam 38mg20m, SL or IV q4 hr anxiety  Haldol .5mg 102m SL or IV q4 hr PRN agitation  Glycopyrrolate .2mg I55m4hr secretions  Referral for residential hospice if patient appears stable after transition to full comfort measures     Code Status/Advance Care Planning:  DNR    Symptom Management:   As above  Palliative Prophylaxis:   Frequent Pain  Assessment  Additional Recommendations (Limitations, Scope, Preferences):  Full Comfort Care  Prognosis:    < 2 weeks  Discharge Planning: To Be Determined hospital death vs residential hospice  Primary Diagnoses: Present on Admission: . Sepsis (HCC)  [place], interviewed the patient and family, and examined the patient. The following aspects are pertinent.  Past Medical History:  Diagnosis Date  . Arthritis   . Cardiomyopathy (HCC)  [place] (cong[place]  [place]  [place]   . Depression   . Diabetes mellitus without complication (HCC)  [place]   . Hyperlipidemia   . Hypertension   . Macular degeneration    Social History   Socioeconomic History  . Marital status: Widowed    Spouse name: Not on file  . Number of children: Not on file  . Years of education: Not on file  . Highest education level: Not on file  Occupational History  . Not on file  Social Needs  . Financial resource strain: Patient refused  . Food insecurity:    Worry: Patient refused    Inability: Patient refused  . Transportation needs:    Medical: Patient refused    Non-medical: Patient refused  Tobacco Use  . Smoking status: Former Smoker    Packs/day: 1.00    Last attempt to quit: 04/05/1958    Years since quitting: 60.0  . Smokeless tobacco: Never Used  Substance and Sexual Activity  . Alcohol use: Not on file  . Drug use: Not on file  . Sexual activity: Not on file  Lifestyle  . Physical activity:    Days per week: Patient refused    Minutes per session: Patient refused  . Stress: Patient refused  Relationships  . Social connections:    Talks on phone: Patient refused    Gets together: Patient refused    Attends religious service: Patient refused    Active member of club or organization: Patient refused    Attends meetings of clubs or organizations: Patient  refused    Relationship status: Patient refused  Other Topics Concern  . Not on file  Social History Narrative   Son helps with medications daily   Family History  Problem Relation Age of Onset  . Stroke Mother   . CAD Father   . Heart attack Father    Scheduled Meds: . FLUoxetine  10 mg Oral Daily  . Melatonin  5 mg Oral QHS  . sodium chloride flush  3 mL Intravenous Q12H   Continuous Infusions: . fluconazole (DIFLUCAN) IV Stopped (04/15/18 1351)   PRN Meds:.antiseptic oral rinse, glycopyrrolate **OR** glycopyrrolate **OR** glycopyrrolate, haloperidol **OR** haloperidol **OR** haloperidol lactate, LORazepam **OR** LORazepam **OR** LORazepam, morphine injection, morphine CONCENTRATE **OR** morphine CONCENTRATE, ondansetron **OR** ondansetron (ZOFRAN) IV, polyvinyl  alcohol, traZODone Medications Prior to Admission:  Prior to Admission medications   Medication Sig Start Date End Date Taking? Authorizing Provider  aspirin EC 81 MG tablet Take 81 mg by mouth daily.   Yes [provider]  cholecalciferol (VITAMIN D) 1000 units tablet Take 1,000 Units by mouth daily.   Yes [provider]  docusate sodium (COLACE) 100 MG capsule Take 100 mg by mouth 2 (two) times daily.   Yes [provider]  finasteride (PROSCAR) 5 MG tablet Take 5 mg by mouth daily.   Yes [provider]  FLUoxetine (PROZAC) 10 MG capsule Take 1 capsule by mouth daily. 04/07/2018  Yes [provider]  furosemide (LASIX) 40 MG tablet Take 1 tablet by mouth daily. 04/03/2018  Yes [provider]  gabapentin (NEURONTIN) 300 MG capsule Take 300 mg by mouth 2 (two) times daily.   Yes [provider]  glimepiride (AMARYL) 2 MG tablet Take 1 tablet (2 mg total) by mouth daily with breakfast. 02/24/18 04/25/18 Yes Sainani, Belia Heman, MD  lisinopril (PRINIVIL,ZESTRIL) 20 MG tablet Take 20 mg by mouth daily.   Yes [provider]  metoprolol succinate (TOPROL-XL) 100  MG 24 hr tablet Take 100 mg by mouth daily. Take with or immediately following a meal.   Yes [provider]  pravastatin (PRAVACHOL) 40 MG tablet Take 40 mg by mouth daily.   Yes [provider]   Allergies  Allergen Reactions  . Ezetimibe Other (See Comments)  . Niacin Other (See Comments)    Flushing  . Ace Inhibitors Cough    Tolerating lisinopril 20 mg without side effects 02/01/2015  . Atorvastatin Other (See Comments)    Nightmares  . Losartan Other (See Comments)   Review of Systems  Unable to perform ROS: Patient unresponsive    Physical Exam  Constitutional:  Frail, ill appearing  Pulmonary/Chest:  Increased effort, audible secretions  Neurological:  Opens eyes on command, lethargic, answers questions unintelligibly  Skin: There is pallor.  Nursing note and vitals reviewed.   Vital Signs: BP (!) 146/78 (BP Location: Right Arm)   Pulse 94   Temp 97.9 F (36.6 C) (Oral)   Resp 20   Ht 6' 2"  (1.88 m)   Wt 82.2 kg   SpO2 90%   BMI 23.27 kg/m  Pain Scale: 0-10   Pain Score: 0-No pain   SpO2: SpO2: 90 % O2 Device:SpO2: 90 % O2 Flow Rate: .O2 Flow Rate (L/min): 2 L/min  IO: Intake/output summary:   Intake/Output Summary (Last 24 hours) at 04/15/2018 1611 Last data filed at 04/15/2018 1438 Gross per 24 hour  Intake 317.08 ml  Output 550 ml  Net -232.92 ml    LBM: Last BM Date: 04/13/18 Baseline Weight: Weight: 79.4 kg Most recent weight: Weight: 82.2 kg     Palliative Assessment/Data: PPS: 10%     Thank you for this consult. Palliative medicine will continue to follow and assist as needed.   Time In: 1400 Time Out: 1600 Time Total: 120 minutes Prolonged services billed: yes Greater than 50%  of this time was spent counseling and coordinating care related to the above assessment and plan.  Signed by: Mariana Kaufman, AGNP-C Palliative Medicine    Please contact Palliative Medicine Team phone at [phone] for questions and  concerns.  For individual provider: See Shea Evans

## 2018-04-15 NOTE — Progress Notes (Signed)
SLP Cancellation Note  Patient Details Name: DEVARIO BUCKLEW MRN: [id number] DOB: 01-Apr-1927   Cancelled treatment:       Reason Eval/Treat Not Completed: Patient not medically ready. Noted Palliative Care consult and meeting today w/ family at 1400 for GOC.  ST services will consult w/ Palliative Care and MD after this meeting for plan of care. Recommend frequent oral care for oral hygiene and stimulation of swallowing; aspiration precautions.    Jerilynn Som, MS, CCC-SLP Watson,Katherine 04/15/2018, 8:37 AM

## 2018-04-15 NOTE — Progress Notes (Signed)
Patient ID: Fred Watson, male   DOB: 1927-01-29, 84 y.o.   MRN: [id number]  ACP note  Patient cannot participate in conversation 1 son on the phone while I was in the room with the patient and the other son at the bedside  Diagnosis: Acute delirium on dementia, aspiration pneumonia, acute hypoxic hypercapnic respiratory failure, acute kidney injury with hyperkalemia, acute on chronic systolic congestive heart failure, sepsis on presentation, atrial fibrillation.  Plan.  Palliative care to meet with family today.  Patient currently n.p.o. for high aspiration risk.  Everything will key off the mental status.  Patient has acute delirium and will need to have a better mental status in order to swallow.  If he is unable to swallow he will be a hospice home candidate.  Time spent on ACP discussion 20 minutes Dr. Alford Highland

## 2018-04-15 NOTE — Progress Notes (Signed)
[facility], [place] 04/15/18  Subjective:   Patient is doing poorly.  Appetite remains poor.  He is n.p.o. for aspiration precautions.  Continues to have cough.  Serum creatinine shows slight improvement to 2.20  . Objective:  Vital signs in last 24 hours:  Temp:  [96.7 F (35.9 C)-97.9 F (36.6 C)] 97.8 F (36.6 C) (10/15 0811) Pulse Rate:  [76-96] 78 (10/15 0811) Resp:  [18-29] 22 (10/15 0811) BP: (145-154)/(70-95) 151/95 (10/15 0811) SpO2:  [83 %-96 %] 96 % (10/15 0811) Weight:  [82.2 kg-86.1 kg] 82.2 kg (10/15 0456)  Weight change: -1.8 kg Filed Weights   04/14/18 0015 04/14/18 1222 04/15/18 0456  Weight: 87.9 kg 86.1 kg 82.2 kg    Intake/Output:    Intake/Output Summary (Last 24 hours) at 04/15/2018 0940 Last data filed at 04/14/2018 2300 Gross per 24 hour  Intake 150 ml  Output 600 ml  Net -450 ml     Physical Exam: General:  No acute distress, laying in the bed  HEENT  anicteric, dry oral mucous membranes, decreased hearing  Neck  supple  Pulm/lungs  coarse breath sounds, crackles bilaterally   CVS/Heart  irregular rhythm  Abdomen:   Soft, nontender, nondistended  Extremities:  No peripheral edema  Neurologic:  Lethargic but arousable  Skin:  Scattered bruises          Basic Metabolic Panel:  Recent Labs  Lab 04/11/18 0305 04/12/18 0713 04/12/18 1422 04/13/18 0528 04/14/18 0107  NA 134* 135 137 139 141  K 4.6 5.7* 4.4 4.6 5.0  CL 102 103 105 106 109  CO2 21* 18* 23 24 22   GLUCOSE 202* 196* 233* 185* 219*  BUN 100* 105* 105* 98* 90*  CREATININE 2.89* 3.47* 3.09* 2.88* 2.34*  CALCIUM 8.3* 8.1* 8.1* 8.1* 7.8*     CBC: Recent Labs  Lab 04/09/18 0511 04/10/18 0612 04/11/18 0305 04/12/18 0713 04/14/18 0107  WBC 7.7 15.8* 17.9* 14.4* 10.6*  NEUTROABS  --  15.1*  --   --  9.0*  HGB 11.3* 10.8* 10.7* 11.1* 11.3*  HCT 34.5* 33.4* 32.5* 32.7* 35.5*  MCV 96.1 97.7 96.4 94.5 98.3  PLT 237 302 327 214 309      No results found for: HEPBSAG, HEPBSAB, HEPBIGM    Microbiology:  Recent Results (from the past 240 hour(s))  Urine culture     Status: Abnormal   Collection Time: 04/06/2018  5:16 PM  Result Value Ref Range Status   Specimen Description   Final    URINE, RANDOM Performed at [facility], [address], [place], [place] [address]    Special Requests   Final    NONE Performed at [facility], [address] [place], [place] [address]    Culture >=100,000 COLONIES/mL CITROBACTER KOSERI (A)  Final   Report Status 04/08/2018 FINAL  Final   Organism ID, Bacteria CITROBACTER KOSERI (A)  Final      Susceptibility   Citrobacter koseri - MIC*    CEFAZOLIN <=4 SENSITIVE Sensitive     CEFTRIAXONE <=1 SENSITIVE Sensitive     CIPROFLOXACIN <=0.25 SENSITIVE Sensitive     GENTAMICIN <=1 SENSITIVE Sensitive     IMIPENEM <=0.25 SENSITIVE Sensitive     NITROFURANTOIN 32 SENSITIVE Sensitive     TRIMETH/SULFA <=20 SENSITIVE Sensitive     PIP/TAZO <=4 SENSITIVE Sensitive     * >=100,000 COLONIES/mL CITROBACTER KOSERI  Blood Culture (routine x 2)     Status: None  Collection Time: 04-21-18  5:17 PM  Result Value Ref Range Status   Specimen Description BLOOD  RT WRIST  Final   Special Requests   Final    BOTTLES DRAWN AEROBIC AND ANAEROBIC Blood Culture adequate volume   Culture   Final    NO GROWTH 5 DAYS Performed at [facility], [address] [place], [place] [address]    Report Status 04/10/2018 FINAL  Final  Blood Culture (routine x 2)     Status: Abnormal   Collection Time: 2018/04/21  5:17 PM  Result Value Ref Range Status   Specimen Description   Final    BLOOD LAC Performed at [facility], [address], [place], [place] [address]    Special Requests   Final    BOTTLES DRAWN AEROBIC AND ANAEROBIC Blood Culture adequate volume Performed at [facility], [address] [place], [place] [address]    Culture  Setup Time    Final    AEROBIC BOTTLE ONLY GRAM POSITIVE COCCI IN CLUSTERS CRITICAL RESULT CALLED TO, READ BACK BY AND VERIFIED WITH: CHRIS NESBITT AT 1540 ON 04/06/18 BY [facility] GRAM STAIN REVIEWED-AGREE WITH RESULT    Culture (A)  Final    STAPHYLOCOCCUS SPECIES (COAGULASE NEGATIVE) THE SIGNIFICANCE OF ISOLATING THIS ORGANISM FROM A SINGLE SET OF BLOOD CULTURES WHEN MULTIPLE SETS ARE DRAWN IS UNCERTAIN. PLEASE NOTIFY THE MICROBIOLOGY DEPARTMENT WITHIN ONE WEEK IF SPECIATION AND SENSITIVITIES ARE REQUIRED. Performed at [facility], [address] [place], [place] [address]    Report Status 04/08/2018 FINAL  Final  Blood Culture ID Panel (Reflexed)     Status: Abnormal   Collection Time: 04/21/18  5:17 PM  Result Value Ref Range Status   Enterococcus species NOT DETECTED NOT DETECTED Final   Listeria monocytogenes NOT DETECTED NOT DETECTED Final   Staphylococcus species DETECTED (A) NOT DETECTED Final    Comment: Methicillin (oxacillin) susceptible coagulase negative staphylococcus. Possible blood culture contaminant (unless isolated from more than one blood culture draw or clinical case suggests pathogenicity). No antibiotic treatment is indicated for blood  culture contaminants. CRITICAL RESULT CALLED TO, READ BACK BY AND VERIFIED WITH: CHRIS NESBITT AT 1540 ON 04/06/18 BY SNJ    Staphylococcus aureus (BCID) NOT DETECTED NOT DETECTED Final   Methicillin resistance NOT DETECTED NOT DETECTED Final   Streptococcus species NOT DETECTED NOT DETECTED Final   Streptococcus agalactiae NOT DETECTED NOT DETECTED Final   Streptococcus pneumoniae NOT DETECTED NOT DETECTED Final   Streptococcus pyogenes NOT DETECTED NOT DETECTED Final   Acinetobacter baumannii NOT DETECTED NOT DETECTED Final   Enterobacteriaceae species NOT DETECTED NOT DETECTED Final   Enterobacter cloacae complex NOT DETECTED NOT DETECTED Final   Escherichia coli NOT DETECTED NOT DETECTED Final   Klebsiella oxytoca NOT DETECTED NOT  DETECTED Final   Klebsiella pneumoniae NOT DETECTED NOT DETECTED Final   Proteus species NOT DETECTED NOT DETECTED Final   Serratia marcescens NOT DETECTED NOT DETECTED Final   Haemophilus influenzae NOT DETECTED NOT DETECTED Final   Neisseria meningitidis NOT DETECTED NOT DETECTED Final   Pseudomonas aeruginosa NOT DETECTED NOT DETECTED Final   Candida albicans NOT DETECTED NOT DETECTED Final   Candida glabrata NOT DETECTED NOT DETECTED Final   Candida krusei NOT DETECTED NOT DETECTED Final   Candida parapsilosis NOT DETECTED NOT DETECTED Final   Candida tropicalis NOT DETECTED NOT DETECTED Final    Comment: Performed at [facility], [address], [place], [place] [address]  MRSA PCR Screening     Status: None   Collection Time: 04/06/18  3:25 PM  Result Value Ref Range Status   MRSA by PCR NEGATIVE NEGATIVE Final    Comment:        The GeneXpert MRSA Assay (FDA approved for NASAL specimens only), is one component of a comprehensive MRSA colonization surveillance program. It is not intended to diagnose MRSA infection nor to guide or monitor treatment for MRSA infections. Performed at [facility], [address] [place], [place] [address]   CULTURE, BLOOD (ROUTINE X 2) w Reflex to ID Panel     Status: None (Preliminary result)   Collection Time: 04/14/18  1:19 AM  Result Value Ref Range Status   Specimen Description BLOOD RIGHT HAND  Final   Special Requests   Final    BOTTLES DRAWN AEROBIC AND ANAEROBIC Blood Culture adequate volume   Culture   Final    NO GROWTH 1 DAY Performed at [facility], [address] [place], [place] [address]    Report Status PENDING  Incomplete  Urine Culture     Status: None   Collection Time: 04/14/18  1:30 AM  Result Value Ref Range Status   Specimen Description   Final    URINE, RANDOM Performed at [facility], [address] [place], [place] [address]    Special Requests   Final     NONE Performed at [facility], [address] [place], [place] [address]    Culture   Final    NO GROWTH Performed at [facility], [address], [place], [place] [address]    Report Status 04/15/2018 FINAL  Final  CULTURE, BLOOD (ROUTINE X 2) w Reflex to ID Panel     Status: None (Preliminary result)   Collection Time: 04/14/18  1:43 AM  Result Value Ref Range Status   Specimen Description BLOOD LEFT HAND  Final   Special Requests   Final    BOTTLES DRAWN AEROBIC AND ANAEROBIC Blood Culture adequate volume   Culture   Final    NO GROWTH 1 DAY Performed at [facility], [address] [place], [place] [address]    Report Status PENDING  Incomplete    Coagulation Studies: No results for input(s): LABPROT, INR in the last 72 hours.  Urinalysis: No results for input(s): COLORURINE, LABSPEC, PHURINE, GLUCOSEU, HGBUR, BILIRUBINUR, KETONESUR, PROTEINUR, UROBILINOGEN, NITRITE, LEUKOCYTESUR in the last 72 hours.  Invalid input(s): APPERANCEUR    Imaging: Dg Chest 1 View  Result Date: 04/13/2018 CLINICAL DATA:  Patient with cough and wheeze EXAM: CHEST  1 VIEW COMPARISON:  Chest radiograph 04/11/2018 FINDINGS: Stable enlarged cardiac and mediastinal contours. Interval increase in patchy consolidation within the left and right mid lung. Moderate left pleural effusion. No pneumothorax. IMPRESSION: Increasing consolidation within the left mid lung and right mid lung which may represent infection and/or edema. Cardiomegaly. Moderate left pleural effusion. Electronically Signed   By: Annia Belt M.D.   On: 04/13/2018 21:11   Ct Head Wo Contrast  Result Date: 04/14/2018 CLINICAL DATA:  Altered mental status. Patient was found unresponsive. EXAM: CT HEAD WITHOUT CONTRAST TECHNIQUE: Contiguous axial images were obtained from the base of the skull through the vertex without intravenous contrast. COMPARISON:  None. FINDINGS: Brain: Diffuse cerebral atrophy.  Ventricular dilatation consistent with central atrophy. Low-attenuation changes in the deep white matter consistent with small vessel ischemia. No mass effect or midline shift. No abnormal extra-axial fluid  collections. Gray-white matter junctions are distinct. Basal cisterns are not effaced. No acute intracranial hemorrhage. Vascular: Moderate intracranial arterial vascular calcifications are present. Skull: Calvarium appears intact. Sinuses/Orbits: Paranasal sinuses are clear. Diffuse opacification of the left mastoid air cells and left middle ear. Other: None. IMPRESSION: No acute intracranial abnormalities. Chronic atrophy and small vessel ischemic changes. Left mastoid effusions. Fluid in the left middle ear may indicate otomastoiditis. Electronically Signed   By: Burman Nieves M.D.   On: 04/14/2018 00:51   Dg Chest Port 1 View  Result Date: 04/14/2018 CLINICAL DATA:  84 year old male with acute respiratory failure. Subsequent encounter. EXAM: PORTABLE CHEST 1 VIEW COMPARISON:  04/13/2018 chest x-ray. FINDINGS: Cardiomegaly post CABG. Decrease in degree of pulmonary hilar and basilar consolidation. This may represent improving pulmonary edema. Infectious infiltrate particularly right perihilar region and left base not excluded. Left-sided pleural effusion may be present. Biapical pleural thickening without associated bony destruction. No pneumothorax. Calcified aorta. No acute osseous abnormality noted. Fusion thoracic spine. IMPRESSION: 1. Decrease in degree of pulmonary hilar and basilar consolidation. This may represent improving pulmonary edema. Infectious infiltrate particularly right perihilar region and left base not excluded. Left-sided pleural effusion may be present. 2. Cardiomegaly post CABG 3.  Aortic Atherosclerosis (ICD10-I70.0). Electronically Signed   By: Lacy Duverney M.D.   On: 04/14/2018 07:43     Medications:   . sodium chloride Stopped (04/10/18 1040)  . ampicillin-sulbactam  (UNASYN) IV 3 g (04/14/18 2322)  . fluconazole (DIFLUCAN) IV Stopped (04/14/18 1656)   . aspirin EC  81 mg Oral Daily  . budesonide (PULMICORT) nebulizer solution  0.5 mg Nebulization BID  . diltiazem  180 mg Oral Daily  . finasteride  5 mg Oral Daily  . FLUoxetine  10 mg Oral Daily  . heparin injection (subcutaneous)  5,000 Units Subcutaneous Q8H  . insulin aspart  0-5 Units Subcutaneous QHS  . insulin aspart  0-9 Units Subcutaneous TID WC  . ipratropium-albuterol  3 mL Nebulization Q6H  . metoprolol succinate  100 mg Oral Daily  . nystatin  5 mL Oral QID  . QUEtiapine  12.5 mg Oral QHS  . sodium chloride flush  3 mL Intravenous Q12H   sodium chloride, acetaminophen **OR** acetaminophen, albuterol, diltiazem, haloperidol lactate, ondansetron **OR** ondansetron (ZOFRAN) IV, traZODone  Assessment/ Plan:  84 y.o. Caucasian male with medical problems of COPD, CHF, CAD, DM-2, HTN, atrial fibrillation who was admitted to [facility] on 04-20-2018 with generalized weakness.   1.  Acute renal failure 2.  Urinary tract infection, Citrobacter 3.  Hyperkalemia 4.  Altered mental status 5.  Aspiration pneumonia  Patient has baseline fragile health and presently critically ill with multisystem involvement.  He has a urinary tract infection with Citrobacter, Atrial fibrillation with hemodynamic instability, aspiration pneumonia. Renal ultrasound is negative for obstruction Serum creatinine slightly lower, UOP 700 cc Continue supportive care.   LOS: 10 Ramonia Mcclaran Thedore Mins 10/15/20199:40 AM  [facility] [place], [place] [phone]  Note: This note was prepared with Dragon dictation. Any transcription errors are unintentional

## 2018-04-15 NOTE — Care Management Important Message (Signed)
Copy of signed IM left with patient and son in room.  

## 2018-04-15 NOTE — Progress Notes (Signed)
Patient ID: Fred Watson, male   DOB: [birth year], 84 y.o.   MRN: [id number]   [employer] [facility] PROGRESS NOTE  [name] JXB:[id number] DOB: 04-Dec-1926 DOA: 04/30/2018 PCP: Mickey Farber, MD  HPI/Subjective: Patient opens his eyes to stimulation.  Still with acute delirium that has not cleared.  Did not sleep well last night as per her son.  Objective: Vitals:   04/15/18 0456 04/15/18 0811  BP: (!) 145/85 (!) 151/95  Pulse: 80 78  Resp: (!) 22 (!) 22  Temp: (!) 96.7 F (35.9 C) 97.8 F (36.6 C)  SpO2: 96% 96%    Filed Weights   04/14/18 0015 04/14/18 1222 04/15/18 0456  Weight: 87.9 kg 86.1 kg 82.2 kg    ROS: Review of Systems  Unable to perform ROS: Acuity of condition   Exam: Physical Exam  HENT:  Nose: No mucosal edema.  Mouth/Throat: No oropharyngeal exudate or posterior oropharyngeal edema.  Eyes: Pupils are equal, round, and reactive to light. Conjunctivae and lids are normal.  Neck: No JVD present. Carotid bruit is not present. No edema present. No thyroid mass and no thyromegaly present.  Cardiovascular: S1 normal and S2 normal. An irregularly irregular rhythm present. Exam reveals no gallop.  No murmur heard. Pulses:      Dorsalis pedis pulses are 2+ on the right side, and 2+ on the left side.  Respiratory: No respiratory distress. He has decreased breath sounds in the right lower field and the left lower field. He has wheezes in the right middle field and the left middle field. He has rhonchi in the right lower field and the left lower field. He has no rales.  GI: Soft. Bowel sounds are normal. There is no tenderness.  Musculoskeletal:       Right ankle: He exhibits no swelling.       Left ankle: He exhibits no swelling.  Lymphadenopathy:    He has no cervical adenopathy.  Neurological: He is alert.  Moves his upper extremities on his own.  Skin: Skin is warm. No rash noted. Nails show no clubbing.  Psychiatric:  Patient with acute delirium.  Only  answers a few questions.      Data Reviewed: Basic Metabolic Panel: Recent Labs  Lab 04/12/18 0713 04/12/18 1422 04/13/18 0528 04/14/18 0107 04/15/18 0643  NA 135 137 139 141 145  K 5.7* 4.4 4.6 5.0 5.3*  CL 103 105 106 109 113*  CO2 18* 23 24 22 25   GLUCOSE 196* 233* 185* 219* 213*  BUN 105* 105* 98* 90* 79*  CREATININE 3.47* 3.09* 2.88* 2.34* 2.20*  CALCIUM 8.1* 8.1* 8.1* 7.8* 8.0*  PHOS  --   --   --   --  3.9   CBC: Recent Labs  Lab 04/09/18 0511 04/10/18 0612 04/11/18 0305 04/12/18 0713 04/14/18 0107  WBC 7.7 15.8* 17.9* 14.4* 10.6*  NEUTROABS  --  15.1*  --   --  9.0*  HGB 11.3* 10.8* 10.7* 11.1* 11.3*  HCT 34.5* 33.4* 32.5* 32.7* 35.5*  MCV 96.1 97.7 96.4 94.5 98.3  PLT 237 302 327 214 309    CBG: Recent Labs  Lab 04/14/18 1231 04/14/18 1710 04/14/18 2058 04/15/18 0750 04/15/18 1203  GLUCAP 287* 232* 166* 189* 163*    Recent Results (from the past 240 hour(s))  Urine culture     Status: Abnormal   Collection Time: 04/30/18  5:16 PM  Result Value Ref Range Status   Specimen Description   Final  URINE, RANDOM Performed at [facility] [address] [place], [place] [address]    Special Requests   Final    NONE Performed at [facility], [address] [place], [place] [address]    Culture >=100,000 COLONIES/mL CITROBACTER KOSERI (A)  Final   Report Status 04/08/2018 FINAL  Final   Organism ID, Bacteria CITROBACTER KOSERI (A)  Final      Susceptibility   Citrobacter koseri - MIC*    CEFAZOLIN <=4 SENSITIVE Sensitive     CEFTRIAXONE <=1 SENSITIVE Sensitive     CIPROFLOXACIN <=0.25 SENSITIVE Sensitive     GENTAMICIN <=1 SENSITIVE Sensitive     IMIPENEM <=0.25 SENSITIVE Sensitive     NITROFURANTOIN 32 SENSITIVE Sensitive     TRIMETH/SULFA <=20 SENSITIVE Sensitive     PIP/TAZO <=4 SENSITIVE Sensitive     * >=100,000 COLONIES/mL CITROBACTER KOSERI  Blood Culture (routine x 2)     Status: None   Collection Time:  04/09/2018  5:17 PM  Result Value Ref Range Status   Specimen Description BLOOD  RT WRIST  Final   Special Requests   Final    BOTTLES DRAWN AEROBIC AND ANAEROBIC Blood Culture adequate volume   Culture   Final    NO GROWTH 5 DAYS Performed at [facility], [address] [place], [place] [address]    Report Status 04/10/2018 FINAL  Final  Blood Culture (routine x 2)     Status: Abnormal   Collection Time: April 16, 2018  5:17 PM  Result Value Ref Range Status   Specimen Description   Final    BLOOD LAC Performed at [facility], [address], [place], [place] [address]    Special Requests   Final    BOTTLES DRAWN AEROBIC AND ANAEROBIC Blood Culture adequate volume Performed at [facility], [address] [place], [place] [address]    Culture  Setup Time   Final    AEROBIC BOTTLE ONLY GRAM POSITIVE COCCI IN CLUSTERS CRITICAL RESULT CALLED TO, READ BACK BY AND VERIFIED WITH: CHRIS NESBITT AT 1540 ON 04/06/18 BY [facility] GRAM STAIN REVIEWED-AGREE WITH RESULT    Culture (A)  Final    STAPHYLOCOCCUS SPECIES (COAGULASE NEGATIVE) THE SIGNIFICANCE OF ISOLATING THIS ORGANISM FROM A SINGLE SET OF BLOOD CULTURES WHEN MULTIPLE SETS ARE DRAWN IS UNCERTAIN. PLEASE NOTIFY THE MICROBIOLOGY DEPARTMENT WITHIN ONE WEEK IF SPECIATION AND SENSITIVITIES ARE REQUIRED. Performed at [facility], [address], [place], [place] [address]    Report Status 04/08/2018 FINAL  Final  Blood Culture ID Panel (Reflexed)     Status: Abnormal   Collection Time: 04/23/2018  5:17 PM  Result Value Ref Range Status   Enterococcus species NOT DETECTED NOT DETECTED Final   Listeria monocytogenes NOT DETECTED NOT DETECTED Final   Staphylococcus species DETECTED (A) NOT DETECTED Final    Comment: Methicillin (oxacillin) susceptible coagulase negative staphylococcus. Possible blood culture contaminant (unless isolated from more than one blood culture draw or clinical case suggests  pathogenicity). No antibiotic treatment is indicated for blood  culture contaminants. CRITICAL RESULT CALLED TO, READ BACK BY AND VERIFIED WITH: CHRIS NESBITT AT 1540 ON 04/06/18 BY SNJ    Staphylococcus aureus (BCID) NOT DETECTED NOT DETECTED Final   Methicillin resistance NOT DETECTED NOT DETECTED Final   Streptococcus species NOT DETECTED NOT DETECTED Final   Streptococcus agalactiae NOT DETECTED NOT DETECTED Final   Streptococcus pneumoniae NOT DETECTED NOT DETECTED Final   Streptococcus pyogenes NOT DETECTED  NOT DETECTED Final   Acinetobacter baumannii NOT DETECTED NOT DETECTED Final   Enterobacteriaceae species NOT DETECTED NOT DETECTED Final   Enterobacter cloacae complex NOT DETECTED NOT DETECTED Final   Escherichia coli NOT DETECTED NOT DETECTED Final   Klebsiella oxytoca NOT DETECTED NOT DETECTED Final   Klebsiella pneumoniae NOT DETECTED NOT DETECTED Final   Proteus species NOT DETECTED NOT DETECTED Final   Serratia marcescens NOT DETECTED NOT DETECTED Final   Haemophilus influenzae NOT DETECTED NOT DETECTED Final   Neisseria meningitidis NOT DETECTED NOT DETECTED Final   Pseudomonas aeruginosa NOT DETECTED NOT DETECTED Final   Candida albicans NOT DETECTED NOT DETECTED Final   Candida glabrata NOT DETECTED NOT DETECTED Final   Candida krusei NOT DETECTED NOT DETECTED Final   Candida parapsilosis NOT DETECTED NOT DETECTED Final   Candida tropicalis NOT DETECTED NOT DETECTED Final    Comment: Performed at [facility], [address] [place], [place] [address]  MRSA PCR Screening     Status: None   Collection Time: 04/06/18  3:25 PM  Result Value Ref Range Status   MRSA by PCR NEGATIVE NEGATIVE Final    Comment:        The GeneXpert MRSA Assay (FDA approved for NASAL specimens only), is one component of a comprehensive MRSA colonization surveillance program. It is not intended to diagnose MRSA infection nor to guide or monitor treatment for MRSA  infections. Performed at [facility], [address] [place], [place] [address]   CULTURE, BLOOD (ROUTINE X 2) w Reflex to ID Panel     Status: None (Preliminary result)   Collection Time: 04/14/18  1:19 AM  Result Value Ref Range Status   Specimen Description BLOOD RIGHT HAND  Final   Special Requests   Final    BOTTLES DRAWN AEROBIC AND ANAEROBIC Blood Culture adequate volume   Culture   Final    NO GROWTH 1 DAY Performed at [facility], [address] [place], [place] [address]    Report Status PENDING  Incomplete  Urine Culture     Status: None   Collection Time: 04/14/18  1:30 AM  Result Value Ref Range Status   Specimen Description   Final    URINE, RANDOM Performed at [id number], [address] [place], [place] [address]    Special Requests   Final    NONE Performed at [facility] [address] [place], [place] [address]    Culture   Final    NO GROWTH Performed at [facility], [address], [place], [place] [address]    Report Status 04/15/2018 FINAL  Final  CULTURE, BLOOD (ROUTINE X 2) w Reflex to ID Panel     Status: None (Preliminary result)   Collection Time: 04/14/18  1:43 AM  Result Value Ref Range Status   Specimen Description BLOOD LEFT HAND  Final   Special Requests   Final    BOTTLES DRAWN AEROBIC AND ANAEROBIC Blood Culture adequate volume   Culture   Final    NO GROWTH 1 DAY Performed at [facility], [address] [place], [place] [address]    Report Status PENDING  Incomplete     Studies: Dg Chest 1 View  Result Date: 04/13/2018 CLINICAL DATA:  Patient with cough and wheeze EXAM: CHEST  1 VIEW COMPARISON:  Chest radiograph 04/11/2018 FINDINGS: Stable enlarged cardiac and mediastinal contours. Interval increase in patchy consolidation within the left and right mid  lung. Moderate left pleural effusion. No pneumothorax. IMPRESSION: Increasing consolidation within the left mid lung  and right mid lung which may represent infection and/or edema. Cardiomegaly. Moderate left pleural effusion. Electronically Signed   By: Annia Belt M.D.   On: 04/13/2018 21:11   Ct Head Wo Contrast  Result Date: 04/14/2018 CLINICAL DATA:  Altered mental status. Patient was found unresponsive. EXAM: CT HEAD WITHOUT CONTRAST TECHNIQUE: Contiguous axial images were obtained from the base of the skull through the vertex without intravenous contrast. COMPARISON:  None. FINDINGS: Brain: Diffuse cerebral atrophy. Ventricular dilatation consistent with central atrophy. Low-attenuation changes in the deep white matter consistent with small vessel ischemia. No mass effect or midline shift. No abnormal extra-axial fluid collections. Gray-white matter junctions are distinct. Basal cisterns are not effaced. No acute intracranial hemorrhage. Vascular: Moderate intracranial arterial vascular calcifications are present. Skull: Calvarium appears intact. Sinuses/Orbits: Paranasal sinuses are clear. Diffuse opacification of the left mastoid air cells and left middle ear. Other: None. IMPRESSION: No acute intracranial abnormalities. Chronic atrophy and small vessel ischemic changes. Left mastoid effusions. Fluid in the left middle ear may indicate otomastoiditis. Electronically Signed   By: Burman Nieves M.D.   On: 04/14/2018 00:51   Dg Chest Port 1 View  Result Date: 04/14/2018 CLINICAL DATA:  84 year old male with acute respiratory failure. Subsequent encounter. EXAM: PORTABLE CHEST 1 VIEW COMPARISON:  04/13/2018 chest x-ray. FINDINGS: Cardiomegaly post CABG. Decrease in degree of pulmonary hilar and basilar consolidation. This may represent improving pulmonary edema. Infectious infiltrate particularly right perihilar region and left base not excluded. Left-sided pleural effusion may be present. Biapical pleural thickening without associated bony destruction. No pneumothorax. Calcified aorta. No acute osseous  abnormality noted. Fusion thoracic spine. IMPRESSION: 1. Decrease in degree of pulmonary hilar and basilar consolidation. This may represent improving pulmonary edema. Infectious infiltrate particularly right perihilar region and left base not excluded. Left-sided pleural effusion may be present. 2. Cardiomegaly post CABG 3.  Aortic Atherosclerosis (ICD10-I70.0). Electronically Signed   By: Lacy Duverney M.D.   On: 04/14/2018 07:43    Scheduled Meds: . aspirin EC  81 mg Oral Daily  . budesonide (PULMICORT) nebulizer solution  0.5 mg Nebulization BID  . diltiazem  180 mg Oral Daily  . finasteride  5 mg Oral Daily  . FLUoxetine  10 mg Oral Daily  . heparin injection (subcutaneous)  5,000 Units Subcutaneous Q8H  . insulin aspart  0-5 Units Subcutaneous QHS  . insulin aspart  0-9 Units Subcutaneous TID WC  . ipratropium-albuterol  3 mL Nebulization Q6H  . Melatonin  5 mg Oral QHS  . metoprolol succinate  100 mg Oral Daily  . sodium chloride flush  3 mL Intravenous Q12H   Continuous Infusions: . sodium chloride Stopped (04/10/18 1040)  . sodium chloride 10 mL (04/15/18 1249)  . ampicillin-sulbactam (UNASYN) IV 3 g (04/14/18 2322)  . fluconazole (DIFLUCAN) IV 100 mg (04/15/18 1251)    Assessment/Plan:  1. Acute delirium on dementia.  Speech therapy recommended n.p.o.  Start Haldol 1 mg IV every 6 hours as needed.  Increase trazodone and start melatonin at night everything will key on his mental status.  2. Aspiration pneumonia.  Speech therapy still recommends n.p.o.  Continue IV Unasyn.   3. Acute hypoxic hypercapnic respiratory failure.  Patient required BiPAP 2 nights ago.  Currently on nasal cannula. 4. Acute kidney injury with hyperkalemia.  Creatinine improved to 2.2 today.  Potassium 5.3.  Continue to watch. 5. Acute on chronic systolic  and diastolic CHF with EF of 45 to 95%.  Holding off on ACE inhibitor with acute kidney injury.  6. Sepsis on presentation secondary to urinary tract  infection that was treated.  Citrobacter grew out of the urine culture. 7. Atrial fibrillation with rapid ventricular response.  Continue rate controlling medications of metoprolol and Cardizem 8. Essential hypertension.  9. BPH finasteride  10. Hyperlipidemia on Pravachol 11. Neuropathy.  Hold gabapentin with acute kidney injury 12. Type 2 diabetes mellitus-on sliding scale only 13. Overall prognosis is poor.  Code Status:     Code Status Orders  (From admission, onward)         Start     Ordered   04/14/2018 2151  Full code  Continuous     04/24/2018 2150        Code Status History    Date Active Date Inactive Code Status Order ID Comments User Context   02/22/2018 1443 02/24/2018 2003 Full Code [id number]  Shaune Pollack, MD ED     Family Communication: Son at the bedside Disposition Plan: To be determined based on whether he can swallow or not  Consultants:  Cardiology  Speech therapy  Palliative care  Antibiotics:  Unasyn  Time spent: 30 minutes, including ACP time  [employer]

## 2018-04-19 LAB — CULTURE, BLOOD (ROUTINE X 2)
CULTURE: NO GROWTH
Culture: NO GROWTH
Special Requests: ADEQUATE
Special Requests: ADEQUATE

## 2018-05-02 NOTE — Death Summary Note (Signed)
DEATH SUMMARY   Patient Details  Name: Fred Watson MRN: [id number] DOB: February 21, 1927  Admission/Discharge Information   Admit Date:  21-Apr-2018  Date of Death: Date of Death: 2018-05-02  Time of Death: Time of Death: 1120  Length of Stay: 26-Nov-2022  Referring Physician: Mickey Farber, MD   Reason(s) for Hospitalization  Medical admission for clinical sepsis and UTI and pneumonia  Diagnoses  Preliminary cause of death:  Secondary Diagnoses (including complications and co-morbidities):  Active Problems:   Sepsis (HCC)   Atrial fibrillation (HCC)   Shortness of breath   Acute on chronic respiratory failure with hypoxia and hypercapnia (HCC)   Septic shock [facility])   Terminal care   Palliative care by specialist   Advanced care planning/counseling discussion   Goals of care, counseling/discussion   Brief Hospital Course (including significant findings, care, treatment, and services provided and events leading to death)  Fred Watson is a 84 y.o. year old male who was admitted to the hospital on 04/21/18 with clinical sepsis and urinary tract infection and left lower lobe pneumonia and was started on antibiotics.  The patient also had an aspiration pneumonia and switched over to Unasyn and switch to dysphasia diet.  He was a high risk for aspiration.  He had an episode of acute respiratory failure requiring BiPAP and transferred to the ICU.  We will also having problems with acute kidney injury and hyperkalemia.  He was initially treated for acute on chronic systolic congestive heart failure with an EF of 45 to 50% and we were holding his ACE inhibitor with the acute kidney injury.  He also developed atrial fibrillation with rapid ventricular response and required rate controlling medications.  He had declined during the hospital course and had an acute delirium where his mental status worsened over the last 3 days.  Speech therapy made him n.p.o. with his worsening mental status and high risk for  aspiration he was seen by palliative care consultation and made comfort care.  When I came to evaluate him on 05/02/2018 I pronounced him dead.    Pertinent Labs and Studies  Significant Diagnostic Studies Dg Chest 1 View  Result Date: 04/13/2018 CLINICAL DATA:  Patient with cough and wheeze EXAM: CHEST  1 VIEW COMPARISON:  Chest radiograph 04/11/2018 FINDINGS: Stable enlarged cardiac and mediastinal contours. Interval increase in patchy consolidation within the left and right mid lung. Moderate left pleural effusion. No pneumothorax. IMPRESSION: Increasing consolidation within the left mid lung and right mid lung which may represent infection and/or edema. Cardiomegaly. Moderate left pleural effusion. Electronically Signed   By: Annia Belt M.D.   On: 04/13/2018 21:11   Ct Head Wo Contrast  Result Date: 04/14/2018 CLINICAL DATA:  Altered mental status. Patient was found unresponsive. EXAM: CT HEAD WITHOUT CONTRAST TECHNIQUE: Contiguous axial images were obtained from the base of the skull through the vertex without intravenous contrast. COMPARISON:  None. FINDINGS: Brain: Diffuse cerebral atrophy. Ventricular dilatation consistent with central atrophy. Low-attenuation changes in the deep white matter consistent with small vessel ischemia. No mass effect or midline shift. No abnormal extra-axial fluid collections. Gray-white matter junctions are distinct. Basal cisterns are not effaced. No acute intracranial hemorrhage. Vascular: Moderate intracranial arterial vascular calcifications are present. Skull: Calvarium appears intact. Sinuses/Orbits: Paranasal sinuses are clear. Diffuse opacification of the left mastoid air cells and left middle ear. Other: None. IMPRESSION: No acute intracranial abnormalities. Chronic atrophy and small vessel ischemic changes. Left mastoid effusions. Fluid in the left middle ear  may indicate otomastoiditis. Electronically Signed   By: Burman Nieves M.D.   On: 04/14/2018  00:51   US Renal  Result Date: 04/12/2018 CLINICAL DATA:  Acute renal failure EXAM: RENAL / URINARY TRACT ULTRASOUND COMPLETE COMPARISON:  None. FINDINGS: Right Kidney: Length: 9.9 cm. Echogenicity within normal limits. No mass or hydronephrosis visualized. Left Kidney: Length: 10.6 cm. Echogenicity within normal limits. No mass or hydronephrosis visualized. Bladder: Appears normal for degree of bladder distention. IMPRESSION: No acute abnormality noted. Electronically Signed   By: Alcide Clever M.D.   On: 04/12/2018 13:06   Dg Chest Port 1 View  Result Date: 04/14/2018 CLINICAL DATA:  84 year old male with acute respiratory failure. Subsequent encounter. EXAM: PORTABLE CHEST 1 VIEW COMPARISON:  04/13/2018 chest x-ray. FINDINGS: Cardiomegaly post CABG. Decrease in degree of pulmonary hilar and basilar consolidation. This may represent improving pulmonary edema. Infectious infiltrate particularly right perihilar region and left base not excluded. Left-sided pleural effusion may be present. Biapical pleural thickening without associated bony destruction. No pneumothorax. Calcified aorta. No acute osseous abnormality noted. Fusion thoracic spine. IMPRESSION: 1. Decrease in degree of pulmonary hilar and basilar consolidation. This may represent improving pulmonary edema. Infectious infiltrate particularly right perihilar region and left base not excluded. Left-sided pleural effusion may be present. 2. Cardiomegaly post CABG 3.  Aortic Atherosclerosis (ICD10-I70.0). Electronically Signed   By: Lacy Duverney M.D.   On: 04/14/2018 07:43   Dg Chest Port 1 View  Result Date: 04/11/2018 CLINICAL DATA:  SOB;   CHF, COPD, diabetes, CAD, EXAM: PORTABLE CHEST 1 VIEW COMPARISON:  04/08/2018 FINDINGS: Median sternotomy and CABG. The heart is enlarged. There is atherosclerotic calcification of the thoracic aorta. There is persistent opacity at the LEFT lung base, consistent atelectasis or infiltrate and pleural  effusion. There is increasing opacity at the RIGHT lung base, consistent atelectasis and probable effusion. IMPRESSION: Stable cardiomegaly. Persistent atelectasis or infiltrate and effusion at the LEFT base. Increasing opacity at the RIGHT base. Electronically Signed   By: Norva Pavlov M.D.   On: 04/11/2018 11:54   Dg Chest Port 1 View  Result Date: 04/08/2018 CLINICAL DATA:  Shortness of breath, increase E L2 level EXAM: PORTABLE CHEST 1 VIEW COMPARISON:  04/30/2018 FINDINGS: There is bilateral interstitial and alveolar airspace opacities. There is a small bilateral pleural effusion, left greater than right. There is left basilar airspace disease. There is no pneumothorax. There is mild stable cardiomegaly. There is evidence of prior CABG. The osseous structures are unremarkable. IMPRESSION: 1. Mild cardiomegaly with bilateral interstitial and alveolar airspace opacities. Small bilateral pleural effusion and left basilar airspace disease. Differential considerations include pulmonary edema versus multilobar pneumonia. Electronically Signed   By: Elige Ko   On: 04/08/2018 11:15   Dg Chest Port 1 View  Result Date: 04/24/2018 CLINICAL DATA:  Pt arrived via ems from home with hypotension and AMS. Pt has chronic UTI's, DM, and afib. Pt intia pressure b y home health RN 50/30, pressure increased with fluids to 92/64. Pt lethargic but in no distress. Hx - cardiomyopathy, CHF, COPD, diabetes, HTN, CABG, non-smoker EXAM: PORTABLE CHEST 1 VIEW COMPARISON:  05/03/2009.  05/02/2009. FINDINGS: Stable changes from prior CABG surgery. Cardiac silhouette is mildly enlarged. No mediastinal or hilar masses. No evidence of adenopathy. There is hazy airspace opacity in the left mid to lower lung. Remainder of the lungs is clear. Pleuroparenchymal scarring noted at the apices is stable. No convincing pleural effusion.  No pneumothorax. Skeletal structures are grossly intact. IMPRESSION: 1.  Mild hazy ground-glass type  opacity noted in the left mid to lower lung. Patient had similar opacities in this location on the prior exams. This may be chronic, or could reflect acute pneumonia. 2. No other evidence of an acute abnormality.  No pulmonary edema. 3. Status post CABG surgery.  Mild cardiomegaly. Electronically Signed   By: Amie Portland M.D.   On: 04/29/2018 17:38    Microbiology Recent Results (from the past 240 hour(s))  CULTURE, BLOOD (ROUTINE X 2) w Reflex to ID Panel     Status: None (Preliminary result)   Collection Time: 04/14/18  1:19 AM  Result Value Ref Range Status   Specimen Description BLOOD RIGHT HAND  Final   Special Requests   Final    BOTTLES DRAWN AEROBIC AND ANAEROBIC Blood Culture adequate volume   Culture   Final    NO GROWTH 2 DAYS Performed at [facility], [address], [place], [place] [address]    Report Status PENDING  Incomplete  Urine Culture     Status: None   Collection Time: 04/14/18  1:30 AM  Result Value Ref Range Status   Specimen Description   Final    URINE, RANDOM Performed at [facility], [address], [place], [place] [address]    Special Requests   Final    NONE Performed at [facility], [address], [place], [place] [address]    Culture   Final    NO GROWTH Performed at [facility], [address], [place], [place] [address]    Report Status 04/15/2018 FINAL  Final  CULTURE, BLOOD (ROUTINE X 2) w Reflex to ID Panel     Status: None (Preliminary result)   Collection Time: 04/14/18  1:43 AM  Result Value Ref Range Status   Specimen Description BLOOD LEFT HAND  Final   Special Requests   Final    BOTTLES DRAWN AEROBIC AND ANAEROBIC Blood Culture adequate volume   Culture   Final    NO GROWTH 2 DAYS Performed at [facility], [address] [place], [place] [address]    Report Status PENDING  Incomplete    Lab Basic Metabolic Panel: Recent Labs  Lab 04/12/18 0713 04/12/18 1422  04/13/18 0528 04/14/18 0107 04/15/18 0643  NA 135 137 139 141 145  K 5.7* 4.4 4.6 5.0 5.3*  CL 103 105 106 109 113*  CO2 18* 23 24 22 25   GLUCOSE 196* 233* 185* 219* 213*  BUN 105* 105* 98* 90* 79*  CREATININE 3.47* 3.09* 2.88* 2.34* 2.20*  CALCIUM 8.1* 8.1* 8.1* 7.8* 8.0*  PHOS  --   --   --   --  3.9   Liver Function Tests: Recent Labs  Lab 04/15/18 0643  ALBUMIN 2.5*   CBC: Recent Labs  Lab 04/10/18 0612 04/11/18 0305 04/12/18 0713 04/14/18 0107  WBC 15.8* 17.9* 14.4* 10.6*  NEUTROABS 15.1*  --   --  9.0*  HGB 10.8* 10.7* 11.1* 11.3*  HCT 33.4* 32.5* 32.7* 35.5*  MCV 97.7 96.4 94.5 98.3  PLT 302 327 214 309   Sepsis Labs: Recent Labs  Lab 04/10/18 0612 04/11/18 0305 04/12/18 0713 04/14/18 0107 04/14/18 0119 04/15/18 0643  PROCALCITON  --   --   --  0.62  --  0.40  WBC 15.8* 17.9* 14.4* 10.6*  --   --   LATICACIDVEN  --   --   --   --  1.1  --  Procedures/Operations  None   Justice Milliron 2018-05-14, 3:50 PM

## 2018-05-02 NOTE — Progress Notes (Signed)
   04/15/2018 1130  Clinical Encounter Type  Visited With Family  Visit Type Death  Referral From Nurse  Consult/Referral To Chaplain   Responded to staff page regarding patient's death.  Son in room, contacting family members.  Chaplain offered emotional support, son expressed need for time alone.  Chaplain to follow up.

## 2018-05-02 NOTE — Progress Notes (Signed)
   2018/04/25 1210  Clinical Encounter Type  Visited With Family;Other (Comment) (pastor and pastoral intern)  Visit Type Follow-up;Death  Spiritual Encounters  Spiritual Needs Emotional   Chaplain followed up with patient's family.  Renato Gails present with Sales executive.  Conversation surrounding son's aunt (patient's sister-in-law) who is on second floor of hospital and her friendship with deceased, impact of loss on family.  Family processing funeral arrangements and continuing to contact family members.  Chaplain spoke of ongoing availability and encouraged family to page for chaplain as needed.

## 2018-05-02 NOTE — Plan of Care (Signed)
Pt passed at 11:20 was found by Dr. Renae Gloss.  Son was at bedside.  COPA called.  Spoke to April Shore. Pt turned down b/c of dementia.  AC got name of funeral home.  Chaplain was in room with the son when I went in to express my sympathy.  Family at bedside.  Support given.

## 2018-05-02 NOTE — Progress Notes (Signed)
Patient ID: [name], male   DOB: Mar 06, 1927, 84 y.o.   MRN: [id number]  Fred Watson into see patient that was made comfort care yesterday.  He was unresponsive to sternal rub, no corneal reflex, no heartbeat present, no carotid pulse, no respirations.  Patient pronounced dead at 11:20 AM  Called nursing staff for update.  Spoke with son at the bedside.  Dr. Alford Highland

## 2018-05-02 DEATH — deceased

## 2018-12-10 IMAGING — US US RENAL
1 series · 14 of 25 positions shown · non-contrast
Comparison: None.

CLINICAL DATA: Acute renal failure

EXAM:
RENAL / URINARY TRACT ULTRASOUND COMPLETE

[Series 1: us renal · 14 of 38 slices shown]
[im 1/38]
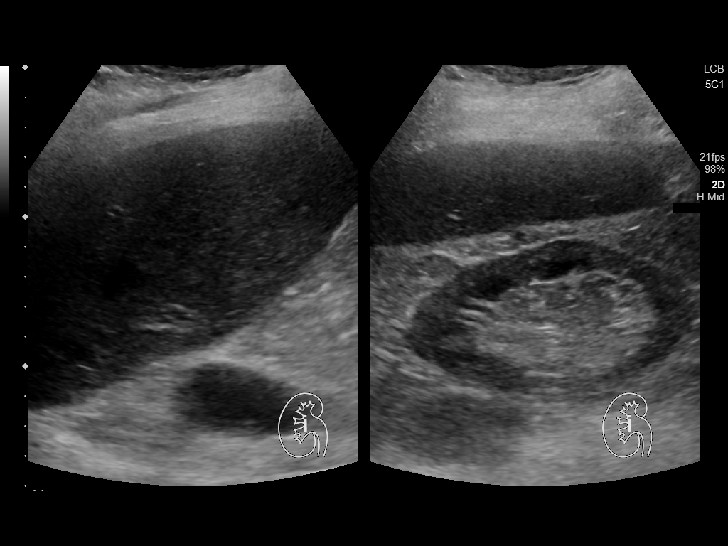
[im 4/38]
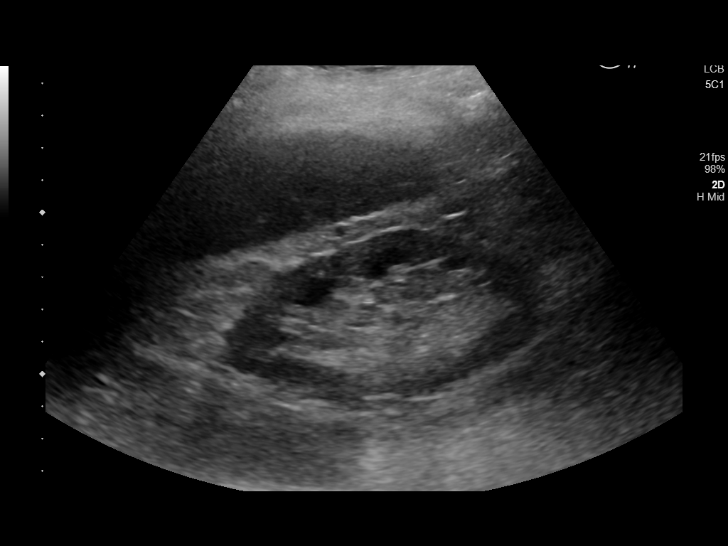
[im 7/38]
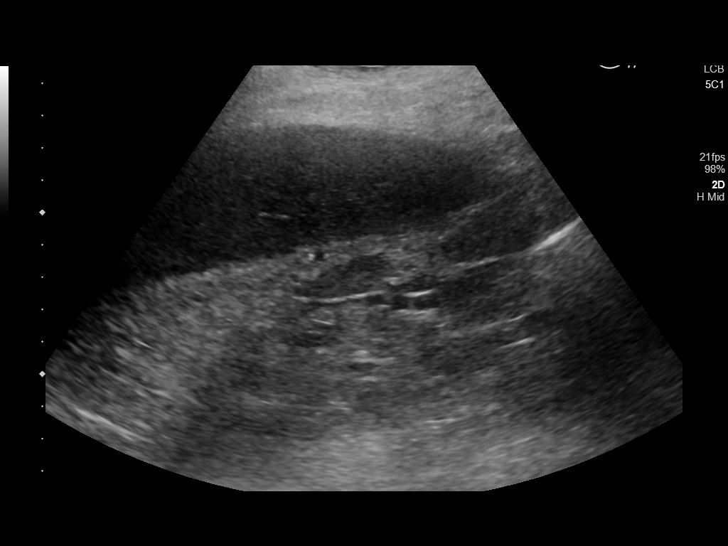
[im 10/38]
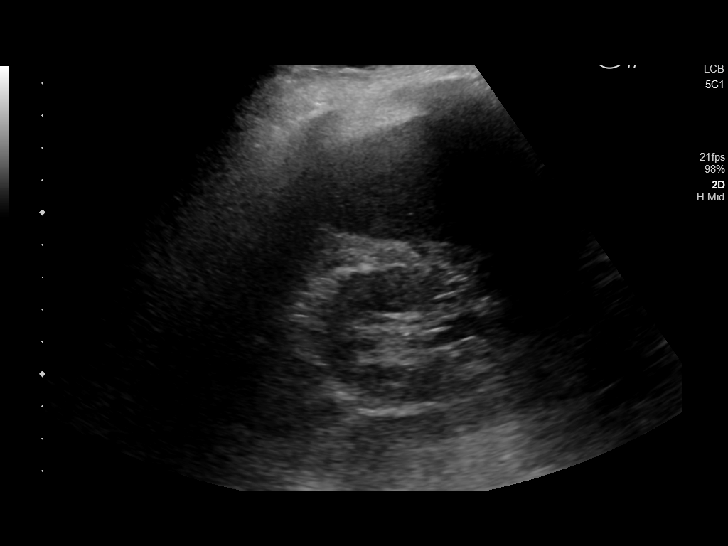
[im 13/38]
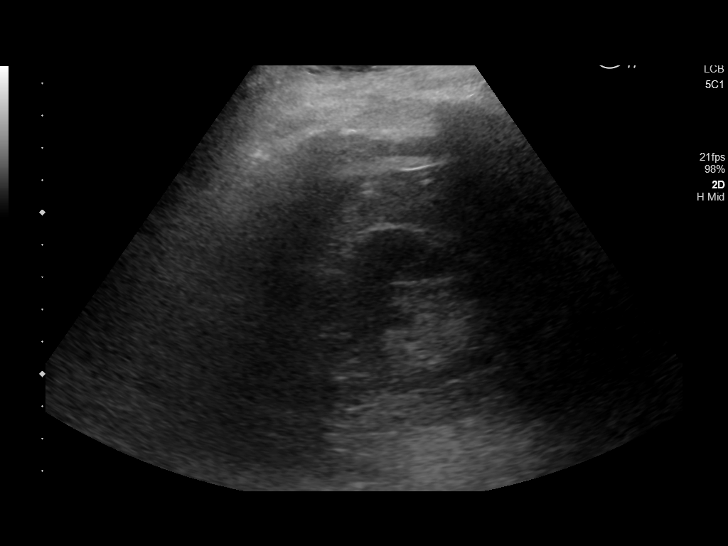
[im 14/38]
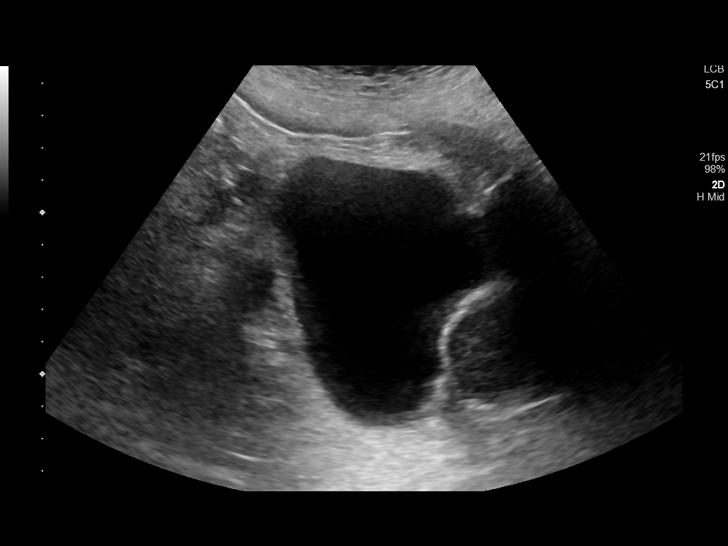
[im 17/38]
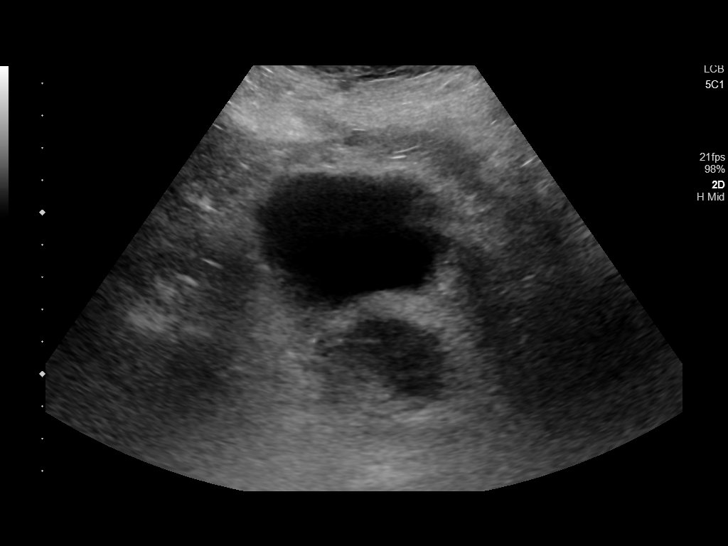
[im 21/38]
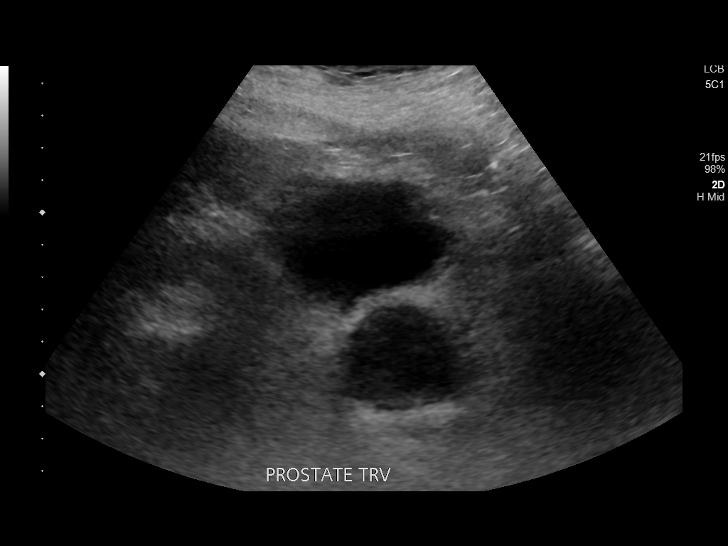
[im 24/38]
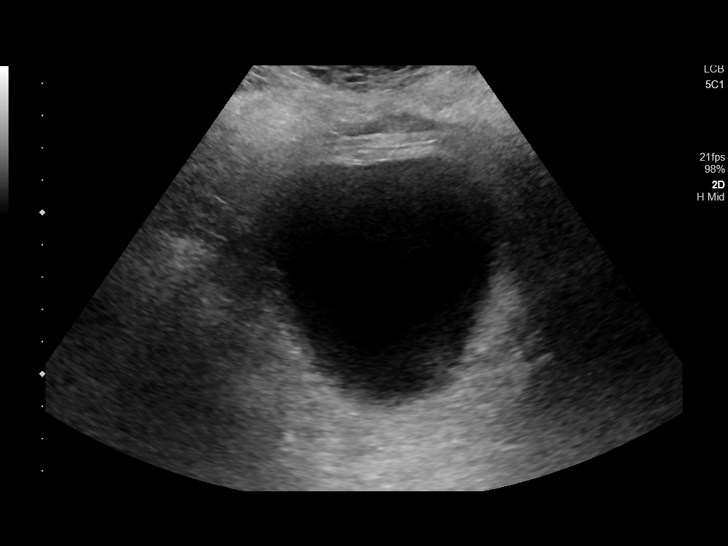
[im 25/38]
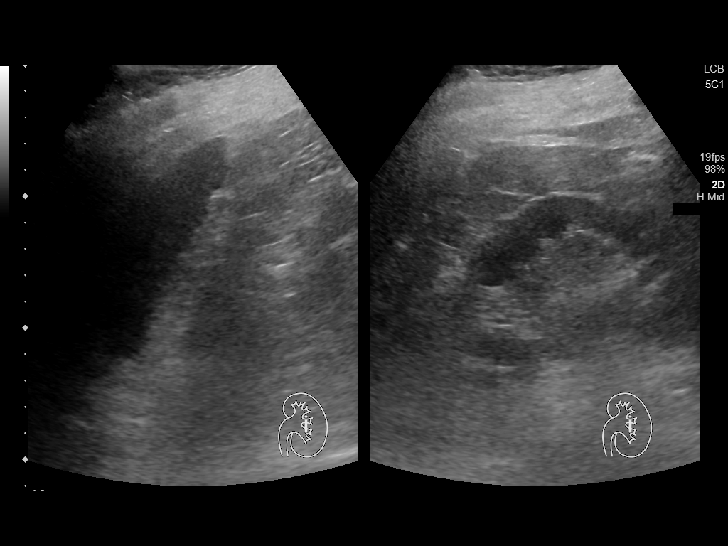
[im 28/38]
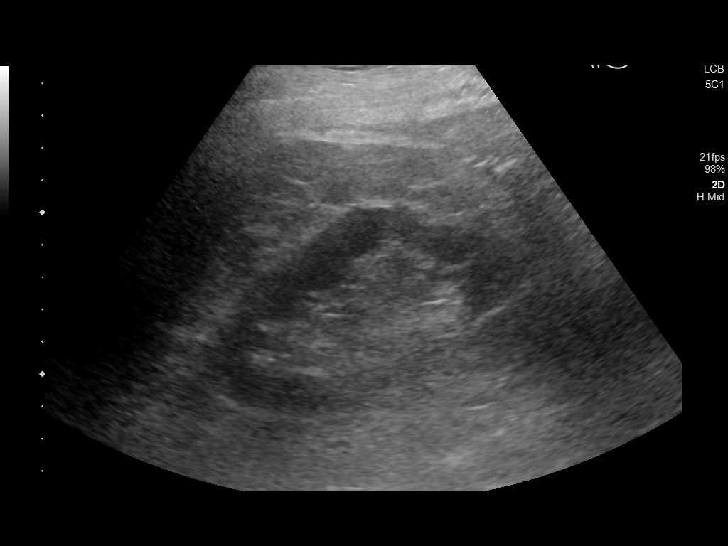
[im 31/38]
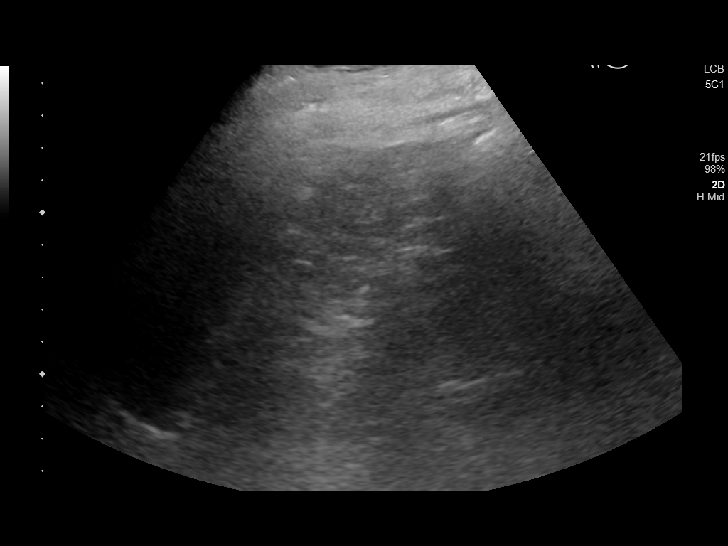
[im 34/38]
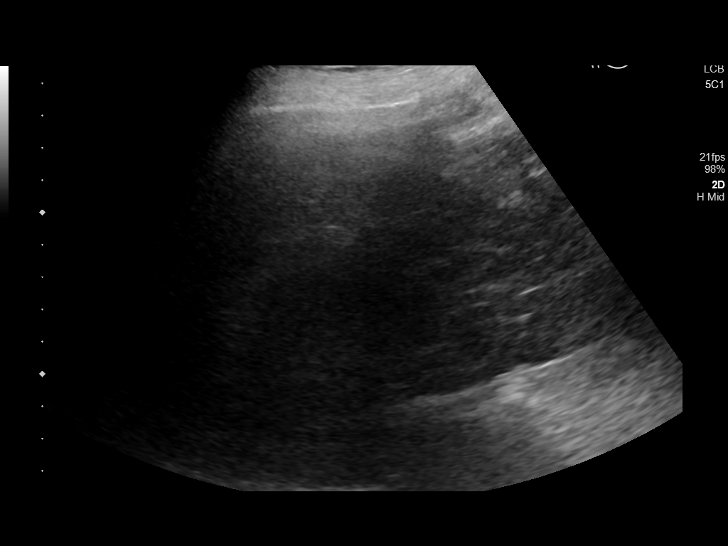
[im 38/38]
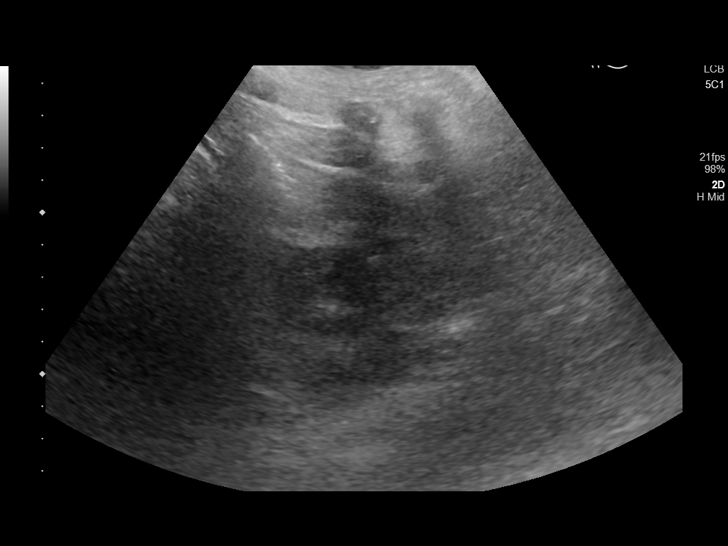

[14 of 25 positions shown; findings below may reference images not displayed]

FINDINGS: Right Kidney:

Length: 9.9 cm. Echogenicity within normal limits. No mass or
hydronephrosis visualized.

Left Kidney:

Length: 10.6 cm. Echogenicity within normal limits. No mass or
hydronephrosis visualized.

Bladder:

Appears normal for degree of bladder distention.
IMPRESSION: No acute abnormality noted.

## 2018-12-12 IMAGING — DX DG CHEST 1V PORT
1 series · 2 of 2 positions shown · non-contrast
Comparison: 04/13/2018 chest x-ray.

CLINICAL DATA: 89-year-old male with acute respiratory failure.
Subsequent encounter.

EXAM:
PORTABLE CHEST 1 VIEW

[Series 1: chest ap · 0.14mm/px · 2 of 2 slices shown]
[im 1/2]
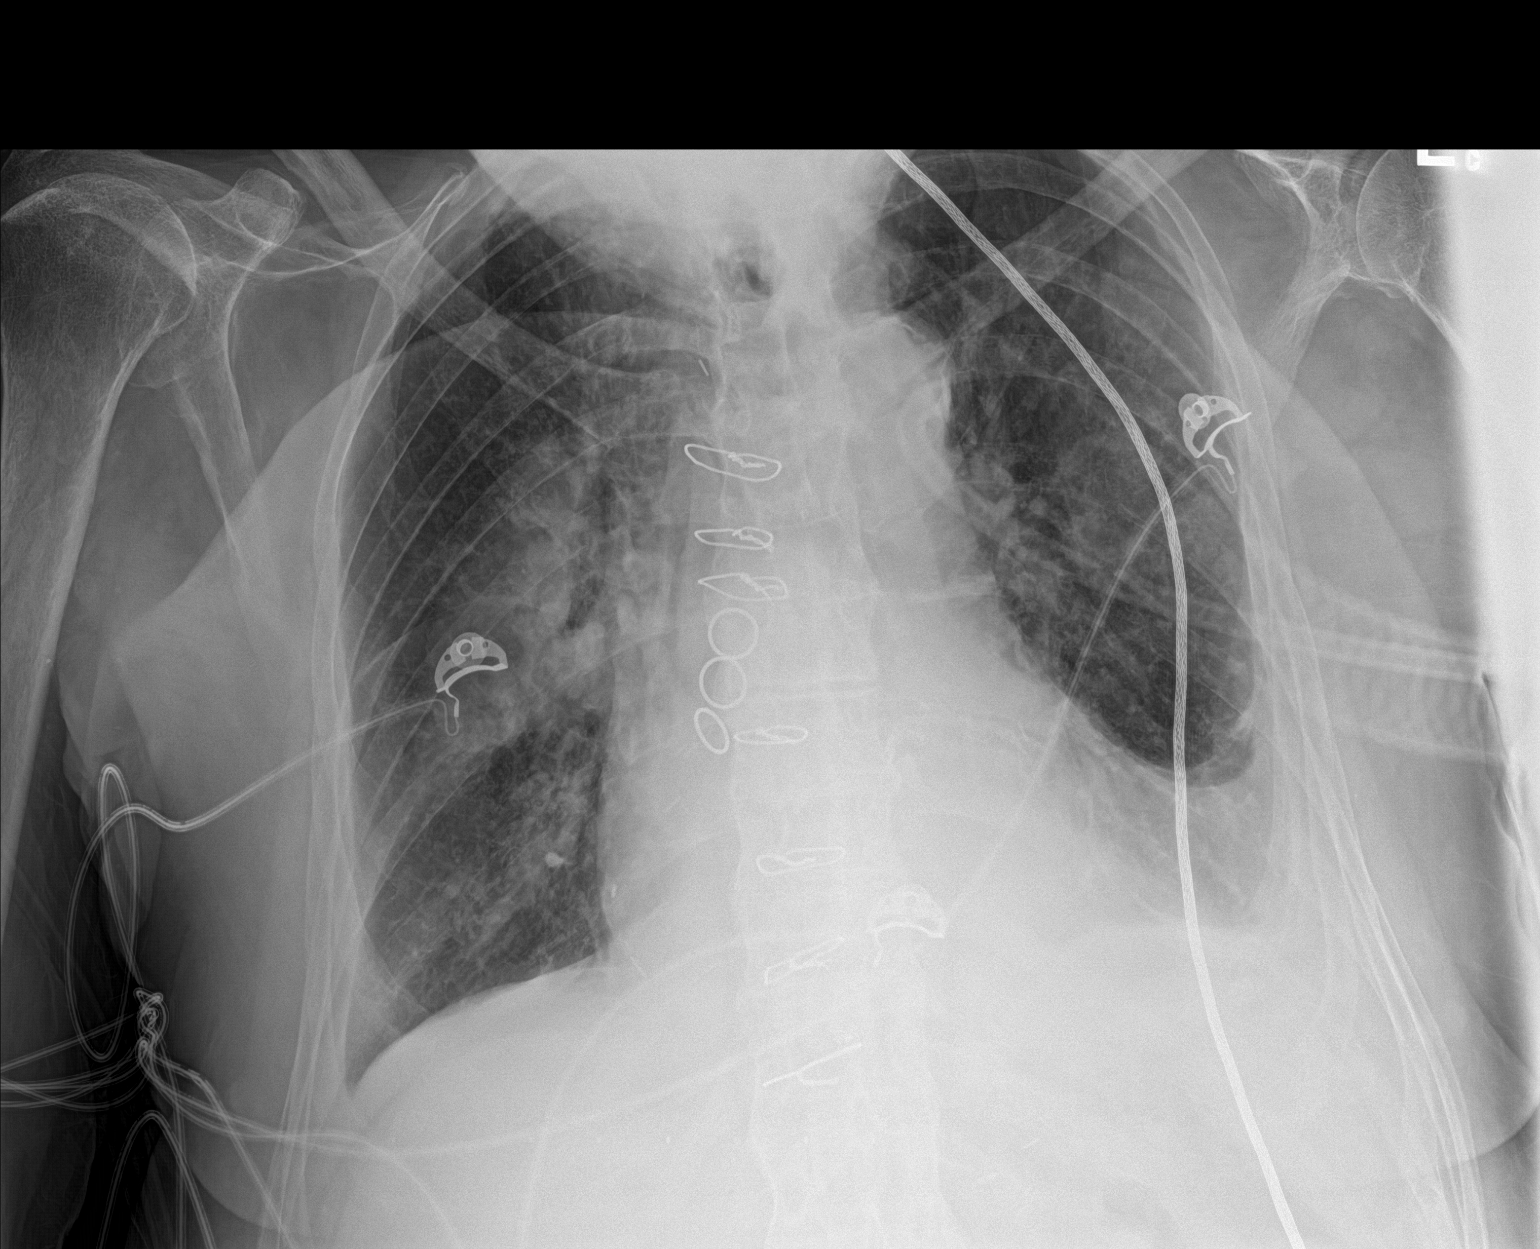
[im 2/2]
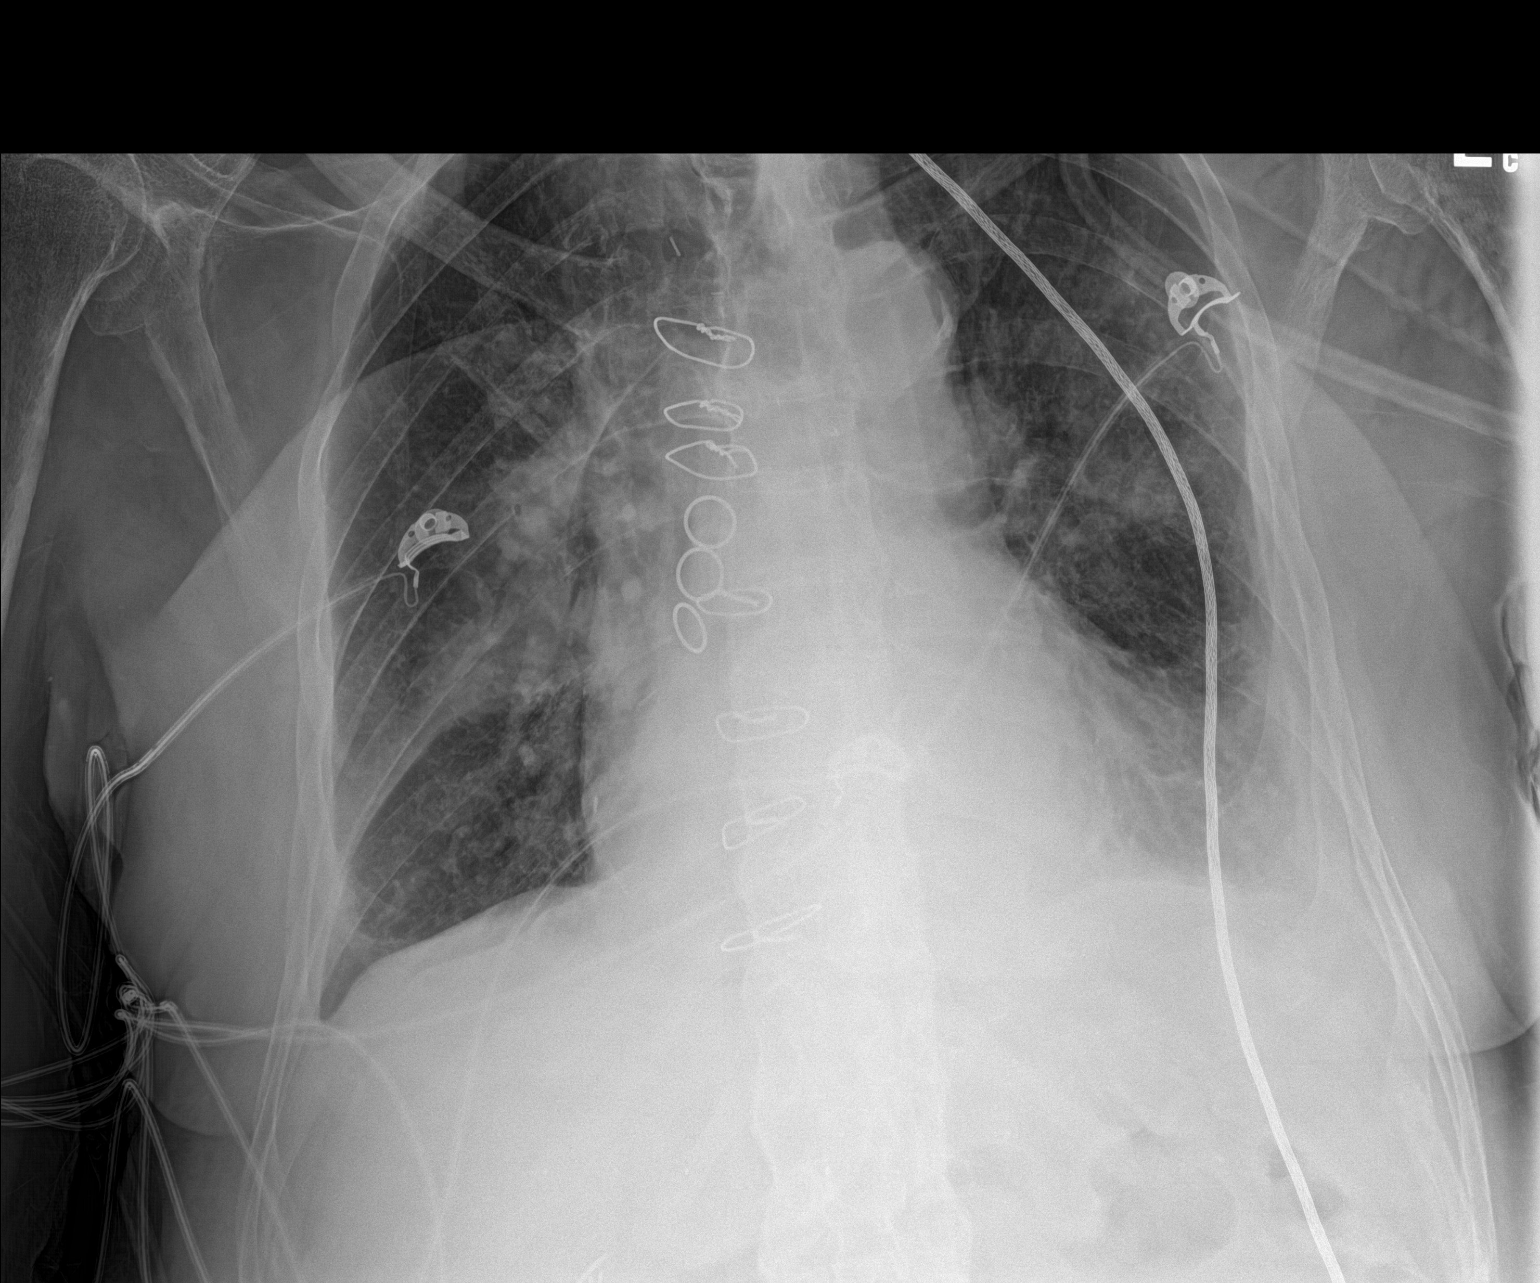

[2 of 2 positions shown; findings below may reference images not displayed]

FINDINGS: Cardiomegaly post CABG. Decrease in degree of pulmonary hilar and
basilar consolidation. This may represent improving pulmonary edema.
Infectious infiltrate particularly right perihilar region and left
base not excluded. Left-sided pleural effusion may be present.

Biapical pleural thickening without associated bony destruction. No
pneumothorax.

Calcified aorta. No acute osseous abnormality noted. Fusion thoracic
spine.
IMPRESSION: 1. Decrease in degree of pulmonary hilar and basilar consolidation.
This may represent improving pulmonary edema. Infectious infiltrate
particularly right perihilar region and left base not excluded.
Left-sided pleural effusion may be present.
2. Cardiomegaly post CABG
3.  Aortic Atherosclerosis ([id number]).
